# Patient Record
Sex: Female | Born: 1945
Health system: Southern US, Community
[De-identification: ages and names within clinical notes are randomized; demographics above are authoritative.]

## PROBLEM LIST (undated history)

## (undated) DIAGNOSIS — D649 Anemia, unspecified: Secondary | ICD-10-CM

## (undated) DIAGNOSIS — M1990 Unspecified osteoarthritis, unspecified site: Secondary | ICD-10-CM

## (undated) DIAGNOSIS — I10 Essential (primary) hypertension: Secondary | ICD-10-CM

## (undated) DIAGNOSIS — K219 Gastro-esophageal reflux disease without esophagitis: Secondary | ICD-10-CM

## (undated) HISTORY — PX: EYE SURGERY: SHX253

## (undated) HISTORY — PX: CHOLECYSTECTOMY: SHX55

## (undated) HISTORY — PX: UTERINE FIBROID SURGERY: SHX826

## (undated) HISTORY — PX: ABDOMINAL HYSTERECTOMY: SHX81

## (undated) SURGERY — ARTHROPLASTY, KNEE, TOTAL
Anesthesia: General | Laterality: Right

---

## 1999-12-14 ENCOUNTER — Encounter: Payer: Self-pay | Admitting: Family Medicine

## 1999-12-14 ENCOUNTER — Ambulatory Visit (HOSPITAL_COMMUNITY): Admission: RE | Admit: 1999-12-14 | Discharge: 1999-12-14 | Payer: Self-pay | Admitting: Family Medicine

## 2001-03-10 ENCOUNTER — Encounter: Payer: Self-pay | Admitting: Family Medicine

## 2001-03-10 ENCOUNTER — Encounter: Admission: RE | Admit: 2001-03-10 | Discharge: 2001-03-10 | Payer: Self-pay | Admitting: Family Medicine

## 2003-06-08 ENCOUNTER — Other Ambulatory Visit: Admission: RE | Admit: 2003-06-08 | Discharge: 2003-06-08 | Payer: Self-pay | Admitting: Family Medicine

## 2003-06-10 ENCOUNTER — Ambulatory Visit (HOSPITAL_COMMUNITY): Admission: RE | Admit: 2003-06-10 | Discharge: 2003-06-10 | Payer: Self-pay | Admitting: Family Medicine

## 2005-03-21 ENCOUNTER — Inpatient Hospital Stay (HOSPITAL_COMMUNITY): Admission: AD | Admit: 2005-03-21 | Discharge: 2005-03-21 | Payer: Self-pay | Admitting: Ophthalmology

## 2005-06-28 ENCOUNTER — Ambulatory Visit (HOSPITAL_COMMUNITY): Admission: RE | Admit: 2005-06-28 | Discharge: 2005-06-29 | Payer: Self-pay | Admitting: Ophthalmology

## 2007-07-08 ENCOUNTER — Ambulatory Visit (HOSPITAL_COMMUNITY): Admission: RE | Admit: 2007-07-08 | Discharge: 2007-07-08 | Payer: Self-pay | Admitting: Orthopedic Surgery

## 2008-03-17 ENCOUNTER — Emergency Department (HOSPITAL_COMMUNITY): Admission: EM | Admit: 2008-03-17 | Discharge: 2008-03-17 | Payer: Self-pay | Admitting: Emergency Medicine

## 2008-03-24 ENCOUNTER — Ambulatory Visit (HOSPITAL_COMMUNITY): Admission: RE | Admit: 2008-03-24 | Discharge: 2008-03-24 | Payer: Self-pay | Admitting: Family Medicine

## 2008-03-25 ENCOUNTER — Encounter (HOSPITAL_COMMUNITY): Admission: RE | Admit: 2008-03-25 | Discharge: 2008-04-24 | Payer: Self-pay | Admitting: Family Medicine

## 2010-07-15 ENCOUNTER — Encounter: Payer: Self-pay | Admitting: Family Medicine

## 2010-11-10 NOTE — Op Note (Signed)
[name], Ashley Clements              ACCOUNT NO.:  [id number]   MEDICAL RECORD NO.:  [id number]          PATIENT TYPE:  OIB   LOCATION:  5731                         FACILITY:  [facility]   PHYSICIAN:  Beulah Gandy. Ashley Royalty, M.D. DATE OF BIRTH:  10/17/1944   DATE OF PROCEDURE:  06/28/2005  DATE OF DISCHARGE:                                 OPERATIVE REPORT   ADMISSION DIAGNOSIS:  Macular hole, left eye.   PROCEDURES:  Pars plana vitrectomy left eye; membrane peel, left eye; serum  patch, left eye; gas fluid exchange, left eye.   SURGEON:  Alan Mulder.   ASSISTANT:  Rosalie Doctor, MA   ANESTHESIA:  General.   DETAILS:  Usual prep and drape, peritomies at 10, 2 and 4 o'clock.  A 5 mm  infusion port placed at 4 o'clock and anchored into place. Contact lens ring  anchored into place at 6 and 12 o'clock. Methylcellulose placed on the  cornea and the flat contact lens placed. Pars plana vitrectomy begun just  behind the crystalline lens.  Vitreous strands and membranes were  encountered. These were carefully removed under low suction and rapid  cutting guide.  The vitrectomy was carried out into the peripheral vitreous  space and the vitreous was removed down to the vitreous base for 360  degrees. The magnifying contact lens was moved into place and inspection of  the macular hole was seen. The hole was quite deep with yellow granular  material in the depths of the hole. The diamond dusted membrane scraper was  used to peel the internal limiting membrane of the hole and allowed the  edges of the hole to fall together into the middle of the hole.  Additional  remnants were removed. The silicone tip suction line was used to engage  posterior hyaloid and strip it from its attachments to the retina.  A gas  fluid exchange was carried out. Sufficient time was allowed for additional  fluid to track down the walls of the eye and collect in the posterior  segment. The serum patch and the  perfluoropropane 18% mixture was prepared  during this time.  Additional fluid was removed with the [employer]  brush.  The posterior segment was very dry.  Serum patch was delivered. The  C3F8 18% was exchanged for intravitreal gas. The instruments were removed  from the eye and 9-0 nylon was used to close the sclerotomy sites. The  conjunctiva was closed with wet-field cautery at 10 and 2 and with 7-0  chromic at 4.  Polymyxin and gentamicin were irrigated into Tenon's space.  Atropine solution was applied. Marcaine was injected around the globe for  postop pain. TobraDex ophthalmic ointment was placed. Closing tension was 10  with a Barraquer keratometer. Polysporin, a patch and shield were placed.  The patient was awakened and taken to recovery in satisfactory condition.   COMPLICATIONS:  None.   DURATION:  1 hour.      Beulah Gandy. Ashley Royalty, M.D.  Electronically Signed     JDM/MEDQ  D:  06/28/2005  T:  06/29/2005  Job:  [id number]

## 2010-11-10 NOTE — Op Note (Signed)
NAMEZAKHIA, SERES              ACCOUNT NO.:  [id number]   MEDICAL RECORD NO.:  [id number]          PATIENT TYPE:  AMB   LOCATION:  SDS                          FACILITY:  [facility]   PHYSICIAN:  John D. Ashley Royalty, M.D. DATE OF BIRTH:  10/17/1944   DATE OF PROCEDURE:  03/20/2005  DATE OF DISCHARGE:                                 OPERATIVE REPORT   ADMISSION DIAGNOSIS:  Rhegmatogenous retinal detachment in the a left eye.   PROCEDURE:  Scleral buckle, left eye; retinal photocoagulation, left eye;  perfluoropropane injection, left eye.   SURGEON:  Alan Mulder, M.D.   ASSISTANT:  Alma Downs, P.A.   ANESTHESIA:  General.   DETAILS:  Usual prep and drape, 360-degree limbal peritomy, isolation of 4  rectus muscles on 2-0 silk, localization of break at 12 o'clock, scleral  dissection for 360 degrees to admit a #279 intrascleral implant, diathermy  placed in the bed, 279 implant placed with a joint at 8 o'clock, 240 band  placed with a 270 sleeve at 8 o'clock, perforation site chosen at 2 o'clock  in the posterior aspect of the bed.  A large amount of clear colorless  subretinal fluid came forth.  Perfluoropropane 50%, 1.2 mL, was injected  into the vitreous cavity through the pars plana at 12 o'clock.  Another  perforation site was performed at 12 o'clock in the anterior aspect of the  bed; a moderate amount of clear colorless subretinal fluid came forth in  this location.  A 508-G radial segment was fashioned to fit beneath the  break at the 1:30 o'clock.  The scleral flaps were closed with interrupted 4-  0 Mersilene sutures, 8 in number.  The indirect ophthalmoscopy showed the  retina be lying nicely on the buckle with a retinal break well-supported by  the radial element.  The indirect ophthalmoscope laser was moved into place  and 913 burns were placed around the retinal break and on the scleral buckle  for 360 degrees; the power was between 800 and  1200 milliwatts, 1000  microns each and 0.1 seconds each.  The buckle was adjusted and trimmed.  The band was adjusted and trimmed.  The sutures were knotted and trimmed.  The conjunctiva was reposited with 7-0 chromic suture.  Polymyxin and  gentamicin were irrigated into Tenon's space.  Atropine solution was  applied.  Marcaine was injected around the globe for postop pain.  Decadron  10 mg was injected into the lower subconjunctival space.  TobraDex  ophthalmic ointment, a patch and shield were placed.  Closing pressure was  15 with a Barraquer keratometer.  Complications -- none.  Duration -- 2-1/2  hours.  The patient was awakened and taken to Recovery in satisfactory  condition.      Beulah Gandy. Ashley Royalty, M.D.  Electronically Signed     JDM/MEDQ  D:  03/20/2005  T:  03/21/2005  Job:  [id number]

## 2011-08-15 ENCOUNTER — Other Ambulatory Visit (HOSPITAL_COMMUNITY): Payer: Self-pay | Admitting: Family Medicine

## 2011-08-15 DIAGNOSIS — Z139 Encounter for screening, unspecified: Secondary | ICD-10-CM

## 2011-08-21 ENCOUNTER — Ambulatory Visit (HOSPITAL_COMMUNITY)
Admission: RE | Admit: 2011-08-21 | Discharge: 2011-08-21 | Disposition: A | Discharge status: Home / Self Care | Payer: Medicare Other | Source: Ambulatory Visit | Attending: Family Medicine | Admitting: Family Medicine

## 2011-08-21 DIAGNOSIS — Z1231 Encounter for screening mammogram for malignant neoplasm of breast: Secondary | ICD-10-CM | POA: Insufficient documentation

## 2011-08-21 DIAGNOSIS — Z139 Encounter for screening, unspecified: Secondary | ICD-10-CM

## 2012-01-22 ENCOUNTER — Ambulatory Visit
Admission: RE | Admit: 2012-01-22 | Discharge: 2012-01-22 | Disposition: A | Discharge status: Home / Self Care | Payer: Medicare Other | Source: Ambulatory Visit | Attending: Orthopedic Surgery | Admitting: Orthopedic Surgery

## 2012-01-22 ENCOUNTER — Other Ambulatory Visit: Payer: Self-pay | Admitting: Orthopedic Surgery

## 2012-01-22 DIAGNOSIS — R52 Pain, unspecified: Secondary | ICD-10-CM

## 2012-03-19 ENCOUNTER — Encounter (HOSPITAL_COMMUNITY): Payer: Self-pay | Admitting: Pharmacy Technician

## 2012-03-21 ENCOUNTER — Other Ambulatory Visit: Payer: Self-pay | Admitting: Orthopedic Surgery

## 2012-03-21 ENCOUNTER — Encounter (HOSPITAL_COMMUNITY): Payer: Self-pay

## 2012-03-24 ENCOUNTER — Encounter (HOSPITAL_COMMUNITY)
Admission: RE | Admit: 2012-03-24 | Discharge: 2012-03-24 | Disposition: A | Discharge status: Home / Self Care | Payer: Medicare Other | Source: Ambulatory Visit | Attending: Orthopedic Surgery | Admitting: Orthopedic Surgery

## 2012-03-24 ENCOUNTER — Encounter (HOSPITAL_COMMUNITY): Payer: Self-pay

## 2012-03-24 HISTORY — DX: Essential (primary) hypertension: I10

## 2012-03-24 HISTORY — DX: Unspecified osteoarthritis, unspecified site: M19.90

## 2012-03-24 HISTORY — DX: Anemia, unspecified: D64.9

## 2012-03-24 LAB — URINALYSIS, ROUTINE W REFLEX MICROSCOPIC
Ketones, ur: NEGATIVE mg/dL
Leukocytes, UA: NEGATIVE
Protein, ur: NEGATIVE mg/dL
Urobilinogen, UA: 0.2 mg/dL (ref 0.0–1.0)

## 2012-03-24 LAB — COMPREHENSIVE METABOLIC PANEL
BUN: 15 mg/dL (ref 6–23)
CO2: 29 mEq/L (ref 19–32)
Calcium: 9.7 mg/dL (ref 8.4–10.5)
Creatinine, Ser: 0.71 mg/dL (ref 0.50–1.10)
GFR calc Af Amer: >90 mL/min (ref >90)
GFR calc non Af Amer: 87 mL/min — ABNORMAL LOW (ref >90)
Glucose, Bld: 86 mg/dL (ref 70–99)
Total Protein: 7.2 g/dL (ref 6.0–8.3)

## 2012-03-24 LAB — CBC
HCT: 37.4 % (ref 36.0–46.0)
Hemoglobin: 12.2 g/dL (ref 12.0–15.0)
MCH: 27.7 pg (ref 26.0–34.0)
MCHC: 32.6 g/dL (ref 30.0–36.0)
MCV: 85 fL (ref 78.0–100.0)

## 2012-03-24 LAB — SURGICAL PCR SCREEN: Staphylococcus aureus: POSITIVE — AB

## 2012-03-24 LAB — PROTIME-INR: Prothrombin Time: 13.8 seconds (ref 11.6–15.2)

## 2012-03-24 LAB — TYPE AND SCREEN: ABO/RH(D): A POS

## 2012-03-24 LAB — ABO/RH: ABO/RH(D): A POS

## 2012-03-24 MED ORDER — CHLORHEXIDINE GLUCONATE 4 % EX LIQD: 60.0000 mL | Freq: Once | CUTANEOUS | Status: DC

## 2012-03-24 NOTE — Pre-Procedure Instructions (Signed)
20 [name]  03/24/2012   Your procedure is scheduled on:  Thursday April 03, 2012  Report to [facility] at 5:30 AM.  Call this number if you have problems the morning of surgery: [phone]   Remember:   Do not eat food or drink After Midnight.      Take these medicines the morning of surgery with A SIP OF WATER: ranitidine, evista   Do not wear jewelry, make-up or nail polish.  Do not wear lotions, powders, or perfumes  Do not shave 48 hours prior to surgery. Men may shave face and neck.  Do not bring valuables to the hospital.  Contacts, dentures or bridgework may not be worn into surgery.  Leave suitcase in the car. After surgery it may be brought to your room.  For patients admitted to the hospital, checkout time is 11:00 AM the day of discharge.   Patients discharged the day of surgery will not be allowed to drive home.  Name and phone number of your driver: family / friend  Special Instructions: Incentive Spirometry - Practice and bring it with you on the day of surgery. Shower using CHG 2 nights before surgery and the night before surgery.  If you shower the day of surgery use CHG.  Use special wash - you have one bottle of CHG for all showers.  You should use approximately 1/3 of the bottle for each shower.   Please read over the following fact sheets that you were given: Pain Booklet, Coughing and Deep Breathing, Blood Transfusion Information, Total Joint Packet, MRSA Information and Surgical Site Infection Prevention

## 2012-04-02 MED ORDER — DEXTROSE 5 % IV SOLN
3.0000 g | INTRAVENOUS | Status: DC
  Filled 2012-04-02: qty 3000

## 2012-04-03 ENCOUNTER — Other Ambulatory Visit: Payer: Self-pay | Admitting: Orthopedic Surgery

## 2012-04-03 ENCOUNTER — Encounter (HOSPITAL_COMMUNITY): Payer: Self-pay | Admitting: Anesthesiology

## 2012-04-03 ENCOUNTER — Encounter (HOSPITAL_COMMUNITY): Payer: Self-pay | Admitting: *Deleted

## 2012-04-03 ENCOUNTER — Encounter (HOSPITAL_COMMUNITY)
Admission: RE | Disposition: A | Discharge status: Home Health | Payer: Self-pay | Source: Ambulatory Visit | Attending: Orthopedic Surgery

## 2012-04-03 ENCOUNTER — Inpatient Hospital Stay (HOSPITAL_COMMUNITY): Payer: Medicare Other | Admitting: Anesthesiology

## 2012-04-03 ENCOUNTER — Inpatient Hospital Stay (HOSPITAL_COMMUNITY)
Admission: RE | Admit: 2012-04-03 | Discharge: 2012-04-07 | DRG: 470 | Disposition: A | Discharge status: Home Health | Payer: Medicare Other | Source: Ambulatory Visit | Attending: Orthopedic Surgery | Admitting: Orthopedic Surgery

## 2012-04-03 DIAGNOSIS — I10 Essential (primary) hypertension: Secondary | ICD-10-CM | POA: Diagnosis present

## 2012-04-03 DIAGNOSIS — G8918 Other acute postprocedural pain: Secondary | ICD-10-CM

## 2012-04-03 DIAGNOSIS — D649 Anemia, unspecified: Secondary | ICD-10-CM | POA: Diagnosis present

## 2012-04-03 DIAGNOSIS — E119 Type 2 diabetes mellitus without complications: Secondary | ICD-10-CM | POA: Diagnosis present

## 2012-04-03 DIAGNOSIS — M1710 Unilateral primary osteoarthritis, unspecified knee: Principal | ICD-10-CM | POA: Diagnosis present

## 2012-04-03 HISTORY — PX: TOTAL KNEE ARTHROPLASTY: SHX125

## 2012-04-03 LAB — GLUCOSE, CAPILLARY: Glucose-Capillary: 85 mg/dL (ref 70–99)

## 2012-04-03 SURGERY — ARTHROPLASTY, KNEE, TOTAL
Anesthesia: General | Site: Knee | Laterality: Right | Wound class: Clean

## 2012-04-03 MED ORDER — ACETAMINOPHEN 10 MG/ML IV SOLN
1000.0000 mg | Freq: Once | INTRAVENOUS | Status: AC
  Administered 2012-04-03: 1000 mg via INTRAVENOUS
  Filled 2012-04-03: qty 100

## 2012-04-03 MED ORDER — NALOXONE HCL 0.4 MG/ML IJ SOLN: 0.4000 mg | INTRAMUSCULAR | Status: DC | PRN

## 2012-04-03 MED ORDER — DIPHENHYDRAMINE HCL 50 MG/ML IJ SOLN: 12.5000 mg | Freq: Four times a day (QID) | INTRAMUSCULAR | Status: DC | PRN

## 2012-04-03 MED ORDER — PHENYLEPHRINE HCL 10 MG/ML IJ SOLN
INTRAMUSCULAR | Status: DC | PRN
  Administered 2012-04-03 (×3): 40 ug via INTRAVENOUS
  Administered 2012-04-03: 80 ug via INTRAVENOUS
  Administered 2012-04-03 (×2): 40 ug via INTRAVENOUS

## 2012-04-03 MED ORDER — CEFAZOLIN SODIUM 1-5 GM-% IV SOLN
1.0000 g | Freq: Four times a day (QID) | INTRAVENOUS | Status: AC
  Administered 2012-04-04 (×2): 1 g via INTRAVENOUS
  Filled 2012-04-03 (×2): qty 50

## 2012-04-03 MED ORDER — CEFAZOLIN SODIUM-DEXTROSE 2-3 GM-% IV SOLR
2.0000 g | Freq: Once | INTRAVENOUS | Status: AC
  Administered 2012-04-03: 2 g via INTRAVENOUS

## 2012-04-03 MED ORDER — ACETAMINOPHEN 650 MG RE SUPP: 650.0000 mg | Freq: Four times a day (QID) | RECTAL | Status: DC | PRN

## 2012-04-03 MED ORDER — ONDANSETRON HCL 4 MG/2ML IJ SOLN
INTRAMUSCULAR | Status: DC | PRN
  Administered 2012-04-03: 4 mg via INTRAVENOUS

## 2012-04-03 MED ORDER — COUMADIN BOOK
Freq: Once | Status: DC
  Filled 2012-04-03: qty 1

## 2012-04-03 MED ORDER — ACETAMINOPHEN 10 MG/ML IV SOLN
1000.0000 mg | Freq: Four times a day (QID) | INTRAVENOUS | Status: AC
  Administered 2012-04-03 – 2012-04-04 (×2): 1000 mg via INTRAVENOUS
  Filled 2012-04-03 (×3): qty 100

## 2012-04-03 MED ORDER — ROCURONIUM BROMIDE 100 MG/10ML IV SOLN
INTRAVENOUS | Status: DC | PRN
  Administered 2012-04-03: 50 mg via INTRAVENOUS

## 2012-04-03 MED ORDER — ACETAMINOPHEN 325 MG PO TABS: 650.0000 mg | ORAL_TABLET | Freq: Four times a day (QID) | ORAL | Status: DC | PRN

## 2012-04-03 MED ORDER — LACTATED RINGERS IV SOLN
INTRAVENOUS | Status: DC | PRN
  Administered 2012-04-03 (×2): via INTRAVENOUS

## 2012-04-03 MED ORDER — ONDANSETRON HCL 4 MG/2ML IJ SOLN: 4.0000 mg | Freq: Four times a day (QID) | INTRAMUSCULAR | Status: DC | PRN

## 2012-04-03 MED ORDER — FERROUS SULFATE 325 (65 FE) MG PO TABS
325.0000 mg | ORAL_TABLET | Freq: Three times a day (TID) | ORAL | Status: DC
  Administered 2012-04-03 – 2012-04-07 (×11): 325 mg via ORAL
  Filled 2012-04-03 (×14): qty 1

## 2012-04-03 MED ORDER — ONDANSETRON HCL 4 MG/2ML IJ SOLN: 4.0000 mg | Freq: Once | INTRAMUSCULAR | Status: DC | PRN

## 2012-04-03 MED ORDER — PHENOL 1.4 % MT LIQD: 1.0000 | OROMUCOSAL | Status: DC | PRN

## 2012-04-03 MED ORDER — HYDROCHLOROTHIAZIDE 12.5 MG PO CAPS
12.5000 mg | ORAL_CAPSULE | Freq: Every day | ORAL | Status: DC
  Administered 2012-04-04 – 2012-04-07 (×4): 12.5 mg via ORAL
  Filled 2012-04-03 (×5): qty 1

## 2012-04-03 MED ORDER — HYDROMORPHONE HCL PF 1 MG/ML IJ SOLN
0.2500 mg | INTRAMUSCULAR | Status: DC | PRN
  Administered 2012-04-03 (×3): 0.5 mg via INTRAVENOUS

## 2012-04-03 MED ORDER — EPHEDRINE SULFATE 50 MG/ML IJ SOLN
INTRAMUSCULAR | Status: DC | PRN
  Administered 2012-04-03: 5 mg via INTRAVENOUS

## 2012-04-03 MED ORDER — SODIUM CHLORIDE 0.9 % IR SOLN
Status: DC | PRN
  Administered 2012-04-03: 3000 mL

## 2012-04-03 MED ORDER — VALSARTAN-HYDROCHLOROTHIAZIDE 80-12.5 MG PO TABS: 1.0000 | ORAL_TABLET | Freq: Every day | ORAL | Status: DC

## 2012-04-03 MED ORDER — BUPIVACAINE-EPINEPHRINE PF 0.5-1:200000 % IJ SOLN
INTRAMUSCULAR | Status: DC | PRN
  Administered 2012-04-03: 25 mL

## 2012-04-03 MED ORDER — HYDROMORPHONE 0.3 MG/ML IV SOLN
INTRAVENOUS | Status: AC
  Filled 2012-04-03: qty 25

## 2012-04-03 MED ORDER — LIDOCAINE HCL 4 % MT SOLN
OROMUCOSAL | Status: DC | PRN
  Administered 2012-04-03: 4 mL via TOPICAL

## 2012-04-03 MED ORDER — FAMOTIDINE 10 MG PO TABS
10.0000 mg | ORAL_TABLET | Freq: Every day | ORAL | Status: DC
  Administered 2012-04-04 – 2012-04-07 (×4): 10 mg via ORAL
  Filled 2012-04-03 (×5): qty 1

## 2012-04-03 MED ORDER — MENTHOL 3 MG MT LOZG: 1.0000 | LOZENGE | OROMUCOSAL | Status: DC | PRN

## 2012-04-03 MED ORDER — HYDROMORPHONE HCL PF 1 MG/ML IJ SOLN
INTRAMUSCULAR | Status: AC
  Filled 2012-04-03: qty 1

## 2012-04-03 MED ORDER — DOCUSATE SODIUM 100 MG PO CAPS
100.0000 mg | ORAL_CAPSULE | Freq: Two times a day (BID) | ORAL | Status: DC
  Administered 2012-04-03 – 2012-04-07 (×8): 100 mg via ORAL
  Filled 2012-04-03 (×9): qty 1

## 2012-04-03 MED ORDER — OXYCODONE HCL 5 MG PO TABS
5.0000 mg | ORAL_TABLET | ORAL | Status: DC | PRN
  Administered 2012-04-05 (×2): 5 mg via ORAL
  Administered 2012-04-06: 10 mg via ORAL
  Administered 2012-04-06: 5 mg via ORAL
  Administered 2012-04-06: 10 mg via ORAL
  Administered 2012-04-07 (×2): 5 mg via ORAL
  Filled 2012-04-03 (×2): qty 2
  Filled 2012-04-03 (×5): qty 1

## 2012-04-03 MED ORDER — FENTANYL CITRATE 0.05 MG/ML IJ SOLN
INTRAMUSCULAR | Status: DC | PRN
  Administered 2012-04-03 (×4): 50 ug via INTRAVENOUS

## 2012-04-03 MED ORDER — DEXTROSE-NACL 5-0.45 % IV SOLN
INTRAVENOUS | Status: DC
  Administered 2012-04-03 – 2012-04-04 (×3): via INTRAVENOUS

## 2012-04-03 MED ORDER — ONDANSETRON HCL 4 MG PO TABS: 4.0000 mg | ORAL_TABLET | Freq: Four times a day (QID) | ORAL | Status: DC | PRN

## 2012-04-03 MED ORDER — NEOSTIGMINE METHYLSULFATE 1 MG/ML IJ SOLN
INTRAMUSCULAR | Status: DC | PRN
  Administered 2012-04-03: 3 mg via INTRAVENOUS

## 2012-04-03 MED ORDER — DIPHENHYDRAMINE HCL 12.5 MG/5ML PO ELIX: 12.5000 mg | ORAL_SOLUTION | Freq: Four times a day (QID) | ORAL | Status: DC | PRN

## 2012-04-03 MED ORDER — MIDAZOLAM HCL 5 MG/5ML IJ SOLN
INTRAMUSCULAR | Status: DC | PRN
  Administered 2012-04-03: 2 mg via INTRAVENOUS

## 2012-04-03 MED ORDER — METOCLOPRAMIDE HCL 10 MG PO TABS: 5.0000 mg | ORAL_TABLET | Freq: Three times a day (TID) | ORAL | Status: DC | PRN

## 2012-04-03 MED ORDER — ACETAMINOPHEN 10 MG/ML IV SOLN
INTRAVENOUS | Status: AC
  Filled 2012-04-03: qty 100

## 2012-04-03 MED ORDER — PROPOFOL 10 MG/ML IV BOLUS
INTRAVENOUS | Status: DC | PRN
  Administered 2012-04-03: 200 mg via INTRAVENOUS

## 2012-04-03 MED ORDER — IRBESARTAN 75 MG PO TABS
75.0000 mg | ORAL_TABLET | Freq: Every day | ORAL | Status: DC
  Administered 2012-04-04 – 2012-04-07 (×4): 75 mg via ORAL
  Filled 2012-04-03 (×5): qty 1

## 2012-04-03 MED ORDER — RALOXIFENE HCL 60 MG PO TABS
60.0000 mg | ORAL_TABLET | Freq: Every day | ORAL | Status: DC
  Administered 2012-04-04 – 2012-04-07 (×4): 60 mg via ORAL
  Filled 2012-04-03 (×4): qty 1

## 2012-04-03 MED ORDER — METHOCARBAMOL 100 MG/ML IJ SOLN
500.0000 mg | Freq: Four times a day (QID) | INTRAVENOUS | Status: DC | PRN
  Administered 2012-04-03: 500 mg via INTRAVENOUS
  Filled 2012-04-03: qty 5

## 2012-04-03 MED ORDER — LIDOCAINE HCL (CARDIAC) 20 MG/ML IV SOLN
INTRAVENOUS | Status: DC | PRN
  Administered 2012-04-03: 100 mg via INTRAVENOUS

## 2012-04-03 MED ORDER — WARFARIN - PHARMACIST DOSING INPATIENT
Freq: Every day | Status: DC
  Administered 2012-04-06: 18:00:00

## 2012-04-03 MED ORDER — METHOCARBAMOL 500 MG PO TABS
500.0000 mg | ORAL_TABLET | Freq: Four times a day (QID) | ORAL | Status: DC | PRN
  Administered 2012-04-04 – 2012-04-06 (×2): 500 mg via ORAL
  Filled 2012-04-03 (×3): qty 1

## 2012-04-03 MED ORDER — THERA M PLUS PO TABS: 1.0000 | ORAL_TABLET | ORAL | Status: DC

## 2012-04-03 MED ORDER — ATORVASTATIN CALCIUM 10 MG PO TABS
10.0000 mg | ORAL_TABLET | Freq: Every day | ORAL | Status: DC
  Administered 2012-04-04 – 2012-04-06 (×3): 10 mg via ORAL
  Filled 2012-04-03 (×5): qty 1

## 2012-04-03 MED ORDER — WARFARIN VIDEO: Freq: Once | Status: DC

## 2012-04-03 MED ORDER — 0.9 % SODIUM CHLORIDE (POUR BTL) OPTIME
TOPICAL | Status: DC | PRN
  Administered 2012-04-03: 1000 mL

## 2012-04-03 MED ORDER — SODIUM CHLORIDE 0.9 % IJ SOLN: 9.0000 mL | INTRAMUSCULAR | Status: DC | PRN

## 2012-04-03 MED ORDER — ADULT MULTIVITAMIN W/MINERALS CH
1.0000 | ORAL_TABLET | Freq: Every day | ORAL | Status: DC
  Administered 2012-04-03 – 2012-04-07 (×5): 1 via ORAL
  Filled 2012-04-03 (×5): qty 1

## 2012-04-03 MED ORDER — CEFAZOLIN SODIUM 1-5 GM-% IV SOLN
INTRAVENOUS | Status: AC
  Filled 2012-04-03: qty 100

## 2012-04-03 MED ORDER — METOCLOPRAMIDE HCL 5 MG/ML IJ SOLN: 5.0000 mg | Freq: Three times a day (TID) | INTRAMUSCULAR | Status: DC | PRN

## 2012-04-03 MED ORDER — HYDROMORPHONE 0.3 MG/ML IV SOLN
INTRAVENOUS | Status: DC
  Administered 2012-04-03: 11:00:00 via INTRAVENOUS
  Administered 2012-04-03: 2 mg via INTRAVENOUS
  Administered 2012-04-04: 0.8 mg via INTRAVENOUS
  Administered 2012-04-04: 0.199 mg via INTRAVENOUS
  Administered 2012-04-04: 0.4 mg via INTRAVENOUS
  Administered 2012-04-04: 0.2 mg via INTRAVENOUS
  Administered 2012-04-04: 0.339 mg via INTRAVENOUS

## 2012-04-03 MED ORDER — WARFARIN SODIUM 6 MG PO TABS
6.0000 mg | ORAL_TABLET | Freq: Once | ORAL | Status: AC
  Administered 2012-04-03: 6 mg via ORAL
  Filled 2012-04-03: qty 1

## 2012-04-03 MED ORDER — GLYCOPYRROLATE 0.2 MG/ML IJ SOLN
INTRAMUSCULAR | Status: DC | PRN
  Administered 2012-04-03: 0.4 mg via INTRAVENOUS

## 2012-04-03 SURGICAL SUPPLY — 54 items
BANDAGE ELASTIC 4 VELCRO ST LF (GAUZE/BANDAGES/DRESSINGS) ×1 IMPLANT
BANDAGE ELASTIC 6 VELCRO ST LF (GAUZE/BANDAGES/DRESSINGS) ×1 IMPLANT
BANDAGE ESMARK 6X9 LF (GAUZE/BANDAGES/DRESSINGS) ×1 IMPLANT
BANDAGE GAUZE ELAST BULKY 4 IN (GAUZE/BANDAGES/DRESSINGS) ×2 IMPLANT
BLADE SAGITTAL 25.0X1.27X90 (BLADE) ×2 IMPLANT
BNDG CMPR 9X6 STRL LF SNTH (GAUZE/BANDAGES/DRESSINGS)
BNDG CMPR MED 10X6 ELC LF (GAUZE/BANDAGES/DRESSINGS) ×1
BNDG ELASTIC 6X10 VLCR STRL LF (GAUZE/BANDAGES/DRESSINGS) ×2 IMPLANT
BNDG ESMARK 6X9 LF (GAUZE/BANDAGES/DRESSINGS)
BONE CEMENT PALACOSE (Orthopedic Implant) ×2 IMPLANT
BOWL SMART MIX CTS (DISPOSABLE) ×2 IMPLANT
CATH FOLEY LATEX FREE 14FR (CATHETERS) ×2
CATH FOLEY LF 14FR (CATHETERS) IMPLANT
CEMENT BONE PALACOSE (Orthopedic Implant) ×2 IMPLANT
CLOTH BEACON ORANGE TIMEOUT ST (SAFETY) ×2 IMPLANT
COVER SURGICAL LIGHT HANDLE (MISCELLANEOUS) ×2 IMPLANT
CUFF TOURNIQUET SINGLE 34IN LL (TOURNIQUET CUFF) ×1 IMPLANT
CUFF TOURNIQUET SINGLE 44IN (TOURNIQUET CUFF) IMPLANT
DRAPE EXTREMITY T 121X128X90 (DRAPE) ×2 IMPLANT
DRAPE U-SHAPE 47X51 STRL (DRAPES) ×2 IMPLANT
DRESSING ADAPTIC 1/2  N-ADH (PACKING) ×1 IMPLANT
DRSG ADAPTIC 3X8 NADH LF (GAUZE/BANDAGES/DRESSINGS) ×2 IMPLANT
DRSG PAD ABDOMINAL 8X10 ST (GAUZE/BANDAGES/DRESSINGS) ×2 IMPLANT
DURAPREP 26ML APPLICATOR (WOUND CARE) ×2 IMPLANT
ELECT REM PT RETURN 9FT ADLT (ELECTROSURGICAL) ×2
ELECTRODE REM PT RTRN 9FT ADLT (ELECTROSURGICAL) ×1 IMPLANT
EVACUATOR 3/16  PVC DRAIN (DRAIN)
EVACUATOR 3/16 PVC DRAIN (DRAIN) IMPLANT
FACESHIELD LNG OPTICON STERILE (SAFETY) ×2 IMPLANT
GLOVE SS PI 9.0 STRL (GLOVE) ×2 IMPLANT
GLOVE SURG SS PI 7.5 STRL IVOR (GLOVE) ×1 IMPLANT
GOWN PREVENTION PLUS XLARGE (GOWN DISPOSABLE) ×2 IMPLANT
GOWN STRL NON-REIN LRG LVL3 (GOWN DISPOSABLE) ×6 IMPLANT
HANDPIECE INTERPULSE COAX TIP (DISPOSABLE) ×2
IMMOBILIZER KNEE 20 (SOFTGOODS)
IMMOBILIZER KNEE 20 THIGH 36 (SOFTGOODS) IMPLANT
IMMOBILIZER KNEE 22 UNIV (SOFTGOODS) ×2 IMPLANT
IMMOBILIZER KNEE 24 THIGH 36 (MISCELLANEOUS) IMPLANT
IMMOBILIZER KNEE 24 UNIV (MISCELLANEOUS)
KIT BASIN OR (CUSTOM PROCEDURE TRAY) ×2 IMPLANT
KIT ROOM TURNOVER OR (KITS) ×2 IMPLANT
MANIFOLD NEPTUNE II (INSTRUMENTS) ×2 IMPLANT
NS IRRIG 1000ML POUR BTL (IV SOLUTION) ×2 IMPLANT
PACK TOTAL JOINT (CUSTOM PROCEDURE TRAY) ×2 IMPLANT
PAD ARMBOARD 7.5X6 YLW CONV (MISCELLANEOUS) ×4 IMPLANT
SET HNDPC FAN SPRY TIP SCT (DISPOSABLE) ×1 IMPLANT
SPONGE GAUZE 4X4 12PLY (GAUZE/BANDAGES/DRESSINGS) ×2 IMPLANT
STAPLER VISISTAT 35W (STAPLE) ×2 IMPLANT
SUT VIC AB 0 CTB1 27 (SUTURE) ×8 IMPLANT
SUT VIC AB 2-0 CTB1 (SUTURE) ×8 IMPLANT
TOWEL OR 17X24 6PK STRL BLUE (TOWEL DISPOSABLE) ×2 IMPLANT
TOWEL OR 17X26 10 PK STRL BLUE (TOWEL DISPOSABLE) ×2 IMPLANT
TRAY FOLEY CATH 14FR (SET/KITS/TRAYS/PACK) ×1 IMPLANT
WATER STERILE IRR 1000ML POUR (IV SOLUTION) ×4 IMPLANT

## 2012-04-03 NOTE — Progress Notes (Signed)
ANTICOAGULATION CONSULT NOTE - Initial Consult  Pharmacy Consult for Coumadin Indication: VTE prophylaxis  Allergies  Allergen Reactions  . Latex   . Peanut-Containing Drug Products Rash    All nuts    Patient Measurements:  Wt 83.5kg Heparin Dosing Weight:   Vital Signs: Temp: 97.8 F (36.6 C) (10/10 1510) Temp src: Oral (10/10 0613) BP: 110/54 mmHg (10/10 1500) Pulse Rate: 75  (10/10 1510)  Labs: No results found for this basename: HGB:2,HCT:3,PLT:3,APTT:3,LABPROT:3,INR:3,HEPARINUNFRC:3,CREATININE:3,CKTOTAL:3,CKMB:3,TROPONINI:3 in the last 72 hours  CrCl is unknown because there is no height on file for the current visit.   Medical History: Past Medical History  Diagnosis Date  . Hypertension   . Diabetes mellitus     borderline  . Arthritis   . Anemia     Medications:  Prescriptions prior to admission  Medication Sig Dispense Refill  . CALCIUM-VITAMIN D PO Take 1 tablet by mouth 2 (two) times a week.      . celecoxib (CELEBREX) 200 MG capsule Take 400 mg by mouth daily.      . Multiple Vitamins-Minerals (MULTIVITAMINS THER. W/MINERALS) TABS Take 1 tablet by mouth 2 (two) times a week.      . niacin 500 MG tablet Take 100 mg by mouth at bedtime.      . raloxifene (EVISTA) 60 MG tablet Take 60 mg by mouth daily.      Marland Kitchen RANITIDINE HCL PO Take 1 tablet by mouth daily.      . rosuvastatin (CRESTOR) 20 MG tablet Take 20 mg by mouth daily.      . valsartan-hydrochlorothiazide (DIOVAN-HCT) 80-12.5 MG per tablet Take 1 tablet by mouth daily.      Marland Kitchen aspirin 325 MG EC tablet Take 325 mg by mouth at bedtime.        Assessment: 66yof s/p TKA to start Coumadin for VTE prophylaxis.  - Baseline INR: 1.07 - CBC wnl (9/30) - AST/ALT wnl - No significant bleeding reported - Coumadin points: 4  Goal of Therapy:  INR 2-3   Plan:  1. Coumadin 6mg  po x 1 today 2. Daily INR 3. Coumadin educational book/video  Cleon Dew [phone] 04/03/2012,4:05  PM

## 2012-04-03 NOTE — Anesthesia Preprocedure Evaluation (Addendum)
Anesthesia Evaluation  Patient identified by MRN, date of birth, ID band Patient awake, Patient confused and Patient unresponsive    Reviewed: Allergy & Precautions, H&P , NPO status , Patient's Chart, lab work & pertinent test results  History of Anesthesia Complications Negative for: history of anesthetic complications  Airway Mallampati: I TM Distance: >3 FB Neck ROM: full    Dental  (+) Teeth Intact and Dental Advisory Given   Pulmonary          Cardiovascular hypertension, Pt. on medications Rhythm:regular Rate:Normal     Neuro/Psych    GI/Hepatic GERD-  Medicated,  Endo/Other  diabetes, Type 2  Renal/GU      Musculoskeletal   Abdominal   Peds  Hematology   Anesthesia Other Findings   Reproductive/Obstetrics                          Anesthesia Physical Anesthesia Plan  ASA: II  Anesthesia Plan: General   Post-op Pain Management:    Induction: Intravenous  Airway Management Planned: Oral ETT and LMA  Additional Equipment:   Intra-op Plan:   Post-operative Plan: Extubation in OR  Informed Consent: I have reviewed the patients History and Physical, chart, labs and discussed the procedure including the risks, benefits and alternatives for the proposed anesthesia with the patient or authorized representative who has indicated his/her understanding and acceptance.     Plan Discussed with: CRNA, Anesthesiologist and Surgeon  Anesthesia Plan Comments:         Anesthesia Quick Evaluation

## 2012-04-03 NOTE — Transfer of Care (Signed)
Immediate Anesthesia Transfer of Care Note  Patient: Ashley Clements  Procedure(s) Performed: Procedure(s) (LRB) with comments: TOTAL KNEE ARTHROPLASTY (Right)  Patient Location: PACU  Anesthesia Type: General  Level of Consciousness: awake, alert  and oriented  Airway & Oxygen Therapy: Patient Spontanous Breathing and Patient connected to nasal cannula oxygen  Post-op Assessment: Report given to PACU RN and Post -op Vital signs reviewed and stable  Post vital signs: Reviewed  Complications: No apparent anesthesia complications

## 2012-04-03 NOTE — Anesthesia Procedure Notes (Addendum)
Anesthesia Regional Block:  Femoral nerve block  Pre-Anesthetic Checklist: ,, timeout performed, Correct Patient, Correct Site, Correct Laterality, Correct Procedure, Correct Position, site marked, Risks and benefits discussed,  Surgical consent,  Pre-op evaluation,  At surgeon's request and post-op pain management  Laterality: Right  Prep: Maximum Sterile Barrier Precautions used, chloraprep and alcohol swabs       Needles:  Injection technique: Single-shot  Needle Type: Stimulator Needle - 80        Needle insertion depth: 7 cm   Additional Needles:  Procedures: nerve stimulator Femoral nerve block  Nerve Stimulator or Paresthesia:  Response: 0.5 mA, 0.1 ms, 7 cm  Additional Responses:   Narrative:  Start time: 04/03/2012 7:10 AM End time: 04/03/2012 7:15 AM Injection made incrementally with aspirations every 5 mL.  Performed by: Personally  Anesthesiologist: Maren Beach MD  Additional Notes: Pt accepts procedure and risks. 0.5% Marcaine (25cc) w/ epi w/o discomfort or difficulty. GES   Procedure Name: Intubation Date/Time: 04/03/2012 8:03 AM Performed by: Romie Minus K Pre-anesthesia Checklist: Patient identified, Emergency Drugs available, Suction available, Patient being monitored and Timeout performed Patient Re-evaluated:Patient Re-evaluated prior to inductionOxygen Delivery Method: Circle system utilized Preoxygenation: Pre-oxygenation with 100% oxygen Intubation Type: IV induction Ventilation: Mask ventilation without difficulty Laryngoscope Size: Miller and 2 Grade View: Grade I Tube type: Oral Tube size: 7.5 mm Number of attempts: 1 Airway Equipment and Method: Stylet and LTA kit utilized Placement Confirmation: ETT inserted through vocal cords under direct vision,  positive ETCO2 and breath sounds checked- equal and bilateral Secured at: 21 cm Tube secured with: Tape Dental Injury: Teeth and Oropharynx as per pre-operative assessment

## 2012-04-03 NOTE — H&P (Signed)
Ashley Clements is an 66 y.o. female.   Chief Complaint: PAINFUL RIGHT KNEE HPI: THIS IS A 66Y/O FEMALE FOLLOWED FOR DJD OF THE RIGHT KNEE AND TREATED WITH PAIN MED, ANTI-INFLAMMATORY, AND USE OF CANE WITH PROGRESSIVE WORSENING OF FUNCTION.  Past Medical History  Diagnosis Date  . Hypertension   . Diabetes mellitus     borderline  . Arthritis   . Anemia     Past Surgical History  Procedure Date  . Cholecystectomy   . Abdominal hysterectomy   . Eye surgery     Cataract left, Retina  detachment left    History reviewed. No pertinent family history. Social History:  reports that she has never smoked. She does not have any smokeless tobacco history on file. She reports that she does not drink alcohol or use illicit drugs.  Allergies:  Allergies  Allergen Reactions  . Latex   . Peanut-Containing Drug Products Rash    All nuts    Medications Prior to Admission  Medication Sig Dispense Refill  . CALCIUM-VITAMIN D PO Take 1 tablet by mouth 2 (two) times a week.      . celecoxib (CELEBREX) 200 MG capsule Take 400 mg by mouth daily.      . Multiple Vitamins-Minerals (MULTIVITAMINS THER. W/MINERALS) TABS Take 1 tablet by mouth 2 (two) times a week.      . niacin 500 MG tablet Take 100 mg by mouth at bedtime.      . raloxifene (EVISTA) 60 MG tablet Take 60 mg by mouth daily.      Marland Kitchen RANITIDINE HCL PO Take 1 tablet by mouth daily.      . rosuvastatin (CRESTOR) 20 MG tablet Take 20 mg by mouth daily.      . valsartan-hydrochlorothiazide (DIOVAN-HCT) 80-12.5 MG per tablet Take 1 tablet by mouth daily.      Marland Kitchen aspirin 325 MG EC tablet Take 325 mg by mouth at bedtime.        Results for orders placed during the hospital encounter of 04/03/12 (from the past 48 hour(s))  GLUCOSE, CAPILLARY     Status: Normal   Collection Time   04/03/12  6:25 AM      Component Value Range Comment   Glucose-Capillary 85  70 - 99 mg/dL    No results found.  Review of Systems  Constitutional:  Negative.   HENT: Negative.   Eyes: Negative.   Respiratory: Negative.   Cardiovascular: Negative.   Gastrointestinal: Negative.   Genitourinary: Negative.   Musculoskeletal: Positive for joint pain.  Skin: Negative.   Neurological: Negative.   Endo/Heme/Allergies: Negative.   Psychiatric/Behavioral: Negative.     Blood pressure 156/77, pulse 64, temperature 98.2 F (36.8 C), temperature source Oral, resp. rate 18, SpO2 96.00%. Physical Exam RIGHT KNEE WITH EFFUSION, TENDER CREPITUS MEDIAL AND LATERAL COMPARTMENTS, ROM GOOD BUT LACK FULL FLEXION. X-R RIGHT KNEE REVEALS LOSS OF JOINT SPACE ,SCLEROSIS WITH OSTEOPHYTES.   Assessment/Plan DJD RIGHT KNEE/ PLAN RIGHT TKA  Afrika Brick F 04/03/2012, 7:39 AM

## 2012-04-03 NOTE — Progress Notes (Signed)
UR COMPLETED  

## 2012-04-03 NOTE — Anesthesia Postprocedure Evaluation (Signed)
  Anesthesia Post-op Note  Patient: Ashley Clements  Procedure(s) Performed: Procedure(s) (LRB) with comments: TOTAL KNEE ARTHROPLASTY (Right)  Patient Location: PACU  Anesthesia Type: GA combined with regional for post-op pain  Level of Consciousness: awake, oriented, sedated and patient cooperative  Airway and Oxygen Therapy: Patient Spontanous Breathing and Patient connected to nasal cannula oxygen  Post-op Pain: none  Post-op Assessment: Post-op Vital signs reviewed, Patient's Cardiovascular Status Stable, Respiratory Function Stable and Patent Airway  Post-op Vital Signs: stable  Complications: No apparent anesthesia complications

## 2012-04-03 NOTE — Progress Notes (Signed)
Orthopedic Tech Progress Note Patient Details:  Ashley Clements Oct 04, 1945 [id number]  Patient ID: Ashley Clements, female   DOB: 04-Nov-1945, 66 y.o.   MRN: [id number]   Ashley Clements 04/03/2012, 11:47 AM Right cpm 0-60 trapeze bar

## 2012-04-03 NOTE — Preoperative (Signed)
Beta Blockers   Reason not to administer Beta Blockers:Not Applicable 

## 2012-04-03 NOTE — Brief Op Note (Signed)
04/03/2012  10:33 AM  PATIENT:  Otho Ket  66 y.o. female  PRE-OPERATIVE DIAGNOSIS:  degenerative joint disease right knee   POST-OPERATIVE DIAGNOSIS:  degenerative joint disease right knee  PROCEDURE:  Procedure(s) (LRB) with comments: TOTAL KNEE ARTHROPLASTY (Right)  SURGEON:  Surgeon(s) and Role:    * Kennieth Rad, MD - Primary  PHYSICIAN ASSISTANT:   ASSISTANTS: none   ANESTHESIA:   general  EBL:  Total I/O In: 1000 [I.V.:1000] Out: 100 [Urine:100]  BLOOD ADMINISTERED:none  DRAINS: none   LOCAL MEDICATIONS USED:  NONE  SPECIMEN:  No Specimen  DISPOSITION OF SPECIMEN:  N/A  COUNTS:  YES  TOURNIQUET:   Total Tourniquet Time Documented: Thigh (Right) - 93 minutes  DICTATION: .Other Dictation: Dictation Number REPORT # [id number]  PLAN OF CARE: Admit to inpatient   PATIENT DISPOSITION:  PACU - hemodynamically stable.   Delay start of Pharmacological VTE agent (>24hrs) due to surgical blood loss or risk of bleeding: yes

## 2012-04-03 NOTE — H&P (Signed)
NAMECHRISTINEA, Ashley Clements NO.:  [id number]  MEDICAL RECORD NO.:  [id number]  LOCATION:  [facility]                         FACILITY:  [facility]  PHYSICIAN:  Myrtie Neither, MD      DATE OF BIRTH:  April 21, 1946  DATE OF ADMISSION:  04/03/2012 DATE OF DISCHARGE:                             HISTORY & PHYSICAL   CHIEF COMPLAINT:  Painful right knee.  HISTORY OF PRESENT ILLNESS:  This is a 66 year old female being followed in the office for degenerative joint disease involving bilateral knees, the right being worse than the left.  The patient had been treated with therapeutic injections, anti-inflammatories, and Decadron with progressive worsening of symptoms of increased pain.  The patient also has been treated with use of cane.  The patient has increased loss of motion with some giving way and buckling of the right knee more so than the left.  The pain is both at rest as well as on ambulation.  PAST MEDICAL HISTORY:  Hypertension, diabetes mellitus, degenerative joint disease, chronic anemia.  PAST SURGICAL HISTORY:  Cholecystectomy, abdominal hysterectomy, eye surgery on the left cataract and retinal detachment on the left.  ALLERGIES:  None known.  SOCIAL HISTORY:  Negative for use of alcohol, tobacco, or illegal drugs.  FAMILY HISTORY:  High blood pressure, diabetes.  REVIEW OF SYSTEMS:  No cardiac, respiratory, urinary, or bowel symptoms. Basically that of history of present illness.  ALLERGIES:  Latex and use of peanut.  MEDICATIONS:  Aspirin 325 mg a day, calcium with vitamin D 1200 daily, Celebrex 200 mg b.i.d., multivitamin, niacin 500 mg at bedtime, Evista 60 mg daily, ranitidine HCl 1 tab daily, Crestor 20 mg daily, and Diovan/HCTZ 80/12.5 daily.  PHYSICAL EXAMINATION:  GENERAL:  Alert and oriented, no acute distress. Ambulating with slight antalgic gait with use of cane. HEAD:  Normocephalic. EYES:  Sclerae clear. NECK:  Supple. CHEST:  Clear. CARDIAC:   S1 and S2, regular. EXTREMITIES:  Regular knee genu varum.  Crepitus, medial and lateral compartment, +2 effusion.  Tender in both medial and lateral compartment.  Range of motion is good with full flexion.  There is slight flexion contracture.  Negative Homans test.  X-rays revealed loss of joint space both medially and laterally and patellofemoral joint with osteophyte sclerosis.  IMPRESSION:  Degenerative joint disease, right knee.  PLAN:  Right total knee arthroplasty.     Myrtie Neither, MD     AC/MEDQ  D:  04/03/2012  T:  04/03/2012  Job:  [id number]

## 2012-04-04 LAB — CBC
HCT: 28.2 % — ABNORMAL LOW (ref 36.0–46.0)
Hemoglobin: 9.5 g/dL — ABNORMAL LOW (ref 12.0–15.0)
MCHC: 33.7 g/dL (ref 30.0–36.0)
WBC: 6.3 10*3/uL (ref 4.0–10.5)

## 2012-04-04 LAB — PROTIME-INR
INR: 1.39 (ref 0.00–1.49)
Prothrombin Time: 16.7 seconds — ABNORMAL HIGH (ref 11.6–15.2)

## 2012-04-04 LAB — GLUCOSE, CAPILLARY: Glucose-Capillary: 120 mg/dL — ABNORMAL HIGH (ref 70–99)

## 2012-04-04 MED ORDER — WARFARIN SODIUM 5 MG PO TABS
5.0000 mg | ORAL_TABLET | Freq: Once | ORAL | Status: AC
  Administered 2012-04-04: 5 mg via ORAL
  Filled 2012-04-04: qty 1

## 2012-04-04 NOTE — Progress Notes (Signed)
Physical Therapy Treatment Patient Details Name: Ashley Clements MRN: [id number] DOB: [birth year] Today's Date: 04/04/2012 Time: 6578-4696 PT Time Calculation (min): 35 min  PT Assessment / Plan / Recommendation Comments on Treatment Session  66 y.o. female s/p R TKA now POD #1 PM session and she is progressing well despite reports of painful knee.  She is requiring less assist and she was able to walk into the hallway further than the AM session/evaluation.      Follow Up Recommendations  Home health PT        Barriers to Discharge  none      Equipment Recommendations  3 in 1 bedside comode    Recommendations for Other Services  none  Frequency 7X/week   Plan Discharge plan remains appropriate;Frequency remains appropriate    Precautions / Restrictions Precautions Precautions: Knee Precaution Comments: reviewed no pillow precaution Required Braces or Orthoses: Knee Immobilizer - Right Knee Immobilizer - Right: Other (comment) ("maintain KI until discontinued") Restrictions Weight Bearing Restrictions: Yes RLE Weight Bearing: Partial weight bearing RLE Partial Weight Bearing Percentage or Pounds: 50   Pertinent Vitals/Pain Pain 8/10 left knee with gait, PCA encouraged    Mobility  Bed Mobility Supine to Sit: 4: Min assist;HOB elevated;With rails Sitting - Scoot to Edge of Bed: 4: Min assist;With rail Details for Bed Mobility Assistance: verbal cues for 1/2 bridge technique to get into and out of bed.  Min assist needed of right leg.   Transfers Sit to Stand: 5: Supervision;From elevated surface;With upper extremity assist;With armrests;From bed;From chair/3-in-1 Stand to Sit: 5: Supervision;With upper extremity assist;To elevated surface;To bed Details for Transfer Assistance: supervision for safety, verbal cues for safe hand placement and to kick right leg out in front of her while going to sit.   Ambulation/Gait Ambulation/Gait Assistance: 4: Min guard Ambulation  Distance (Feet): 60 Feet Assistive device: Rolling walker Ambulation/Gait Assistance Details: verbal cues for upright posture and to stay closer to RW Gait Pattern: Step-to pattern;Antalgic    Exercises Total Joint Exercises Ankle Circles/Pumps: AROM;Both;20 reps;Supine Quad Sets: AROM;Both;10 reps;Supine Towel Squeeze: AROM;Both;10 reps;Supine Short Arc Quad: AAROM;Right;10 reps;Supine Heel Slides: AAROM;Right;10 reps;Supine Hip ABduction/ADduction: AAROM;Right;10 reps;Supine Straight Leg Raises: AAROM;Right;10 reps;Supine   PT Goals Acute Rehab PT Goals PT Goal: Supine/Side to Sit - Progress: Progressing toward goal PT Goal: Stand to Sit - Progress: Progressing toward goal PT Goal: Ambulate - Progress: Progressing toward goal PT Goal: Perform Home Exercise Program - Progress: Progressing toward goal  Visit Information  Last PT Received On: 04/04/12 Assistance Needed: +1    Subjective Data  Subjective: "I'm not sure I can do this again"   Cognition  Overall Cognitive Status: Appears within functional limits for tasks assessed/performed       End of Session PT - End of Session Equipment Utilized During Treatment: Gait belt;Right knee immobilizer Activity Tolerance: Patient limited by pain Patient left: in bed;with call bell/phone within reach;with family/visitor present CPM Right Knee CPM Right Knee: On Right Knee Flexion (Degrees): 60  Right Knee Extension (Degrees): 0      Sha Burling B. Dellanira Dillow, PT, DPT [phone]   04/04/2012, 4:59 PM

## 2012-04-04 NOTE — Progress Notes (Signed)
CARE MANAGEMENT NOTE 04/04/2012  Patient:  Ashley Clements, Ashley Clements   Account Number:  [id number]  Date Initiated:  04/04/2012  Documentation initiated by:  Vance Peper  Subjective/Objective Assessment:   32 yr. old female s/p right total knee arthroplasty     Action/Plan:   Progression of care and Discharge planninhg. CM spoke with patient regarding home health and DME needs at discharge.Choice offered. Patient has rolling walker. 3in1 and CPm have been ordered.   Anticipated DC Date:  04/05/2012   Anticipated DC Plan:  HOME W HOME HEALTH SERVICES      DC Planning Services  CM consult      PAC Choice  DURABLE MEDICAL EQUIPMENT  HOME HEALTH   Choice offered to / List presented to:  C-1 Patient   DME arranged  3-N-1  CPM        HH arranged  HH-1 RN  HH-2 PT      HH agency  [facility]   Status of service:  Completed, signed off Medicare Important Message given?   (If response is "NO", the following Medicare IM given date fields will be blank) Date Medicare IM given:   Date Additional Medicare IM given:    Discharge Disposition:  HOME W HOME HEALTH SERVICES  Per UR Regulation:    If discussed at Long Length of Stay Meetings, dates discussed:    Comments:

## 2012-04-04 NOTE — Op Note (Signed)
NAMEDORAINE, BRANDIS NO.:  [id number]  MEDICAL RECORD NO.:  [id number]  LOCATION:  5N07C                        FACILITY:  [facility]  PHYSICIAN:  Myrtie Neither, MD      DATE OF BIRTH:  [birth year]  DATE OF PROCEDURE:  04/03/2012 DATE OF DISCHARGE:                              OPERATIVE REPORT   PREOPERATIVE DIAGNOSIS:  Degenerative joint disease, right knee.  POSTOPERATIVE DIAGNOSIS:  Degenerative joint disease, right knee.  ANESTHESIA:  General.  PROCEDURE:  Right total knee arthroplasty, Biomet implant.  The patient was taken to the operating room.  After given adequate preop medications, given general anesthesia and intubated.  Right knee was prepped with DuraPrep and draped in sterile manner.  Tourniquet and Bovie used for hemostasis.  Anterior midline incision made over the right knee extending from the quadriceps down to the tibial tuberosity. Soft tissue dissection was then made exposing paramedial capsule that was extending from the quadriceps down to the tibial tuberosity. Paramedian incision on the capsule was made reflecting the patella laterally, osteophytes about the patella.  Femur and tibia were resected.  The patient has varus deformity.  Soft tissue release was done medially allowing the knee to be brought into neutral slight valgus position.  Knee was then taken up into flexed position.  Tibial bearing surface cutting jig was put in place and 10 mm of proximal tibial surface was resected.  Next reaming down the femoral canal was done followed by placement of the distal femoral cutting jig.  Distal femoral was made followed by sizing which was that of 6 to 7.5 mm.  A 7.5 mm cutting jig was put in place.  Anterior, posterior, and chamfering cuts were then made and other soft tissue and osteophytic resection was done. Trial component was put in place and found to be in very good snug fit. Next attention was turned to the tibia plateau surface  which was measured as a 71 mm.  Trial 10 mm poly was put in place.  Range of motion full extension, full flexion was noted.  Next, a femoral rod for the tibial cut was put in place with a 71 mm jig both in flexed and extended position.  Landmarks were bovied on the tibia.  Guide was removed and then cutting jig was put in place and appropriate tibial bearing surface cuts were made.  Trial components, both the femur as well as the tibia were put in place, 10 mm, still allow some medial and lateral laxity, 12 mm removed the laxity with full flexion, full extension and good stability.  Attention was then turned to the patella which is a size 31 mm small and appropriate cut was made.  With all 3 components in place, 12 mm bearing surface was found to be more stable. There was no abnormal tracking of the patella.  Good medial and lateral stability, full extension and full flexion.  All 3 components were then removed.  The bone was irrigated with the irrigator.  Methylmethacrylate was mixed.  The tibial and patellar components were cemented.  The femoral component was press fitted.  After excess methylmethacrylate was removed in setting of the cement, again range of  motion was found to be good with good patellar tracking.  Tibial bearing surface selected was that of a 12 mm and it was locked in with a key.  Copious irrigation was done.  Tourniquet was let down.  Hemostasis obtained followed by use of 0 Vicryl for the fascia, 2-0 for the subcutaneous, and skin staples for the skin.  Bulky compressive dressing was applied, knee immobilizer applied.  The patient tolerated the procedure quite well and went to recovery room in stable and satisfactory condition.     Myrtie Neither, MD     AC/MEDQ  D:  04/03/2012  T:  04/04/2012  Job:  [id number]

## 2012-04-04 NOTE — Progress Notes (Signed)
[facility]  Patient Status: New  [facility] is providing the following services: RN and PT  Thank you for this referral.  If patient discharges after hours, please call [phone].   Jodene Nam 04/04/2012, 2:58 PM

## 2012-04-04 NOTE — Evaluation (Signed)
Physical Therapy Evaluation Patient Details Name: Ashley Clements MRN: [id number] DOB: 1946-06-02 Today's Date: 04/04/2012 Time: 0834-0900 PT Time Calculation (min): 26 min  PT Assessment / Plan / Recommendation Clinical Impression  Pt is a pleasent and cooperative 66 y.o. female s/p r TKA.  Patient presents with deficits in functional mobility secondary to pain, weakness, ROM limitations and decreased activity tolerance.  Pt will continue to benefit from skilled pt to address deficits and maximize independence for discharge.  Anticipate patient will perform stair negotiation training tomorrow am.  Rec home PT upon discharge.    PT Assessment  Patient needs continued PT services    Follow Up Recommendations  Home health PT    Does the patient have the potential to tolerate intense rehabilitation      Barriers to Discharge Decreased caregiver support Husband may not be able to provide assist if needed    Equipment Recommendations  3 in 1 bedside comode    Recommendations for Other Services     Frequency 7X/week    Precautions / Restrictions Precautions Precautions: Knee Precaution Comments: Explained importance of end range extension/flexion; advised no pillow behind the knee Required Braces or Orthoses: Knee Immobilizer - Right Restrictions Weight Bearing Restrictions: Yes RLE Weight Bearing: Partial weight bearing RLE Partial Weight Bearing Percentage or Pounds: 50   Pertinent Vitals/Pain No numerical value stated; Pt reports increased pain with movement; SpO2 100%      Mobility  Bed Mobility Bed Mobility: Supine to Sit;Sitting - Scoot to Edge of Bed Supine to Sit: 4: Min guard;HOB elevated Sitting - Scoot to [place] of Bed: 4: Min assist;4: Min guard (min assist to control RLE lowering to floor) Details for Bed Mobility Assistance: No VC's needed for technique Transfers Transfers: Sit to Stand;Stand to Sit Sit to Stand: 4: Min guard;From bed;With upper extremity  assist Stand to Sit: 4: Min guard;To chair/3-in-1;With armrests Details for Transfer Assistance: VC's for hand placement and body control  Ambulation/Gait Ambulation/Gait Assistance: 4: Min guard Ambulation Distance (Feet): 18 Feet (to and from bathroom ) Assistive device: Rolling walker Ambulation/Gait Assistance Details: VC's for sequencing and body position within walker Gait Pattern: Step-to pattern;Decreased stride length;Right hip hike;Antalgic;Trunk flexed (hip hike for inability to clear RLE secondary to immobilizer) Gait velocity: decreased General Gait Details: Mild dizziness reported after prolonged standing activity; symptoms resolved upon sitting with rest Stairs: No Wheelchair Mobility Wheelchair Mobility: No       Exercises Total Joint Exercises Ankle Circles/Pumps: AROM;Both;5 reps (advised to continue throughout the day)   PT Diagnosis: Difficulty walking;Abnormality of gait;Generalized weakness;Acute pain  PT Problem List: Decreased strength;Decreased range of motion;Decreased activity tolerance;Decreased mobility;Decreased safety awareness;Pain PT Treatment Interventions: DME instruction;Gait training;Stair training;Functional mobility training;Therapeutic activities;Therapeutic exercise;Patient/family education   PT Goals Acute Rehab PT Goals PT Goal Formulation: With patient Time For Goal Achievement: 04/09/12 Potential to Achieve Goals: Good Pt will go Supine/Side to Sit: with modified independence PT Goal: Supine/Side to Sit - Progress: Goal set today Pt will go Stand to Sit: with modified independence PT Goal: Stand to Sit - Progress: Goal set today Pt will Ambulate: >150 feet;with modified independence;with rolling walker PT Goal: Ambulate - Progress: Goal set today Pt will Go Up / Down Stairs: 1-2 stairs;with modified independence;with rolling walker PT Goal: Up/Down Stairs - Progress: Goal set today Pt will Perform Home Exercise Program:  Independently PT Goal: Perform Home Exercise Program - Progress: Goal set today  Visit Information  Last PT Received On: 04/04/12 Assistance Needed: +1  Subjective Data  Subjective: I have to use the bathroom Patient Stated Goal: to go home   Prior Functioning  Home Living Lives With: Spouse Available Help at Discharge: Family;Available PRN/intermittently (spouse unable to assist) Type of Home: House Home Access: Stairs to enter [employer] of Steps: 1 Entrance Stairs-Rails: None Home Layout: Two level;Able to live on main level with bedroom/bathroom Bathroom Shower/Tub: Tub/shower unit;Door [employer] Toilet: Standard Home Adaptive Equipment: Straight cane;Walker - rolling Prior Function Level of Independence: Independent with assistive device(s) (occassional use of cane) Able to Take Stairs?: Yes Driving: Yes Communication Communication: No difficulties    Cognition  Overall Cognitive Status: Appears within functional limits for tasks assessed/performed Arousal/Alertness: Awake/alert Orientation Level: Appears intact for tasks assessed Behavior During Session: [facility] for tasks performed    Extremity/Trunk Assessment Right Upper Extremity Assessment RUE ROM/Strength/Tone: [facility] for tasks assessed Left Upper Extremity Assessment LUE ROM/Strength/Tone: [facility] for tasks assessed Right Lower Extremity Assessment RLE ROM/Strength/Tone: Deficits;Unable to fully assess;Due to pain;Due to precautions Left Lower Extremity Assessment LLE ROM/Strength/Tone: [facility] for tasks assessed   Balance Balance Balance Assessed: Yes High Level Balance High Level Balance Activites: Side stepping;Backward walking;Turns;Direction changes;Head turns High Level Balance Comments: Performed with steady balance in confined space  End of Session PT - End of Session Equipment Utilized During Treatment: Gait belt;Right knee immobilizer Activity Tolerance: Patient tolerated treatment well;Patient  limited by fatigue;Patient limited by pain Patient left: in chair;with call bell/phone within reach;with family/visitor present Nurse Communication: Mobility status;Other (comment) (advised nsg of urine void post foley) CPM Right Knee CPM Right Knee: Off  GP     Fabio Asa 04/04/2012, 9:30 AM Charlotte Crumb, PT DPT  [phone]

## 2012-04-04 NOTE — Progress Notes (Addendum)
ANTICOAGULATION CONSULT NOTE - Follow up Consult  Pharmacy Consult for Coumadin Indication: VTE prophylaxis  Allergies  Allergen Reactions  . Latex   . Peanut-Containing Drug Products Rash    All nuts    Patient Measurements: Height: 5\' 6"  (167.6 cm) (from 03/24/12 preadmission) Weight: 184 lb (83.462 kg) (from 03/24/12 preadmission) IBW/kg (Calculated) : 59.3  Wt 83.5kg  Vital Signs: Temp: 100 F (37.8 C) (10/11 0600) BP: 95/47 mmHg (10/11 0600) Pulse Rate: 72  (10/11 0600)  Labs:  Basename 04/04/12 0615  HGB 9.5*  HCT 28.2*  PLT 163  APTT --  LABPROT 16.7*  INR 1.39  HEPARINUNFRC --  CREATININE --  CKTOTAL --  CKMB --  TROPONINI --    Estimated Creatinine Clearance: 74.3 ml/min (by C-G formula based on Cr of 0.71).   Assessment: 66 yo female s/p R TKA on 04/03/12 to continue on Coumadin for VTE prophylaxis. Baseline INR of 1.07. INR today is subtherapeutic (1.39) after one dose of 6 mg. Hgb 9.5, plt 163, lower than preop CBC, will follow trend; no bleeding noted per RN.  Educated pt on Coumadin today and provided handout.    Goal of Therapy:  INR 2-3   Plan:  1. Coumadin 5 mg po x 1 today 2. Daily INR   [name] L 04/04/2012,10:11 AM

## 2012-04-04 NOTE — Progress Notes (Signed)
Referral received for SNF. Chart reviewed and CSW has spoken with RNCM who indicates that patient is for DC to home with Home Health and DME.  CSW to sign off. Please re-consult if CSW needs arise.  Leshia Kope T. Freada Twersky, LCSWA  [phone]  

## 2012-04-04 NOTE — Progress Notes (Signed)
Subjective: 1 Day Post-Op Procedure(s) (LRB): TOTAL KNEE ARTHROPLASTY (Right) Patient reports pain as 6 on 0-10 scale.    Objective: Vital signs in last 24 hours: Temp:  [97.5 F (36.4 C)-100 F (37.8 C)] 100 F (37.8 C) (10/11 0600) Pulse Rate:  [51-76] 72  (10/11 0600) Resp:  [12-20] 20  (10/11 0822) BP: (95-127)/(44-73) 95/47 mmHg (10/11 0600) SpO2:  [99 %-100 %] 100 % (10/11 0822) Weight:  [83.462 kg (184 lb)] 83.462 kg (184 lb) (10/10 1700)  Intake/Output from previous day: 10/10 0701 - 10/11 0700 In: 3090 [P.O.:50; I.V.:3040] Out: 3670 [Urine:3620; Blood:50] Intake/Output this shift:     Basename 04/04/12 0615  HGB 9.5*    Basename 04/04/12 0615  WBC 6.3  RBC 3.41*  HCT 28.2*  PLT 163   No results found for this basename: NA:2,K:2,CL:2,CO2:2,BUN:2,CREATININE:2,GLUCOSE:2,CALCIUM:2 in the last 72 hours  Basename 04/04/12 0615  LABPT --  INR 1.39    Neurovascular intact  Assessment/Plan: 1 Day Post-Op Procedure(s) (LRB): TOTAL KNEE ARTHROPLASTY (Right) Up with therapy  Cleota Pellerito F 04/04/2012, 9:44 AM

## 2012-04-05 LAB — CBC
MCH: 27 pg (ref 26.0–34.0)
MCV: 82.5 fL (ref 78.0–100.0)
Platelets: 167 10*3/uL (ref 150–400)
RDW: 14.4 % (ref 11.5–15.5)
WBC: 8.6 10*3/uL (ref 4.0–10.5)

## 2012-04-05 LAB — PROTIME-INR: Prothrombin Time: 19.6 seconds — ABNORMAL HIGH (ref 11.6–15.2)

## 2012-04-05 MED ORDER — CHLORHEXIDINE GLUCONATE 4 % EX LIQD: 60.0000 mL | Freq: Once | CUTANEOUS | Status: DC

## 2012-04-05 MED ORDER — CEFAZOLIN SODIUM-DEXTROSE 2-3 GM-% IV SOLR: 2.0000 g | INTRAVENOUS | Status: DC

## 2012-04-05 MED ORDER — WARFARIN SODIUM 4 MG PO TABS
4.0000 mg | ORAL_TABLET | Freq: Once | ORAL | Status: AC
  Administered 2012-04-05: 4 mg via ORAL
  Filled 2012-04-05: qty 1

## 2012-04-05 NOTE — Progress Notes (Signed)
Physical Therapy Treatment Patient Details Name: Ashley Clements MRN: [id number] DOB: April 18, 1946 Today's Date: 04/05/2012 Time: 4098-1191 PT Time Calculation (min): 26 min  PT Assessment / Plan / Recommendation Comments on Treatment Session  Patient eager to get out of bed and work with therapy. Patient is progressing well and able to increase ambulation and activity tolerance this session.     Follow Up Recommendations  Home health PT     Does the patient have the potential to tolerate intense rehabilitation     Barriers to Discharge        Equipment Recommendations  3 in 1 bedside comode    Recommendations for Other Services    Frequency 7X/week   Plan Discharge plan remains appropriate;Frequency remains appropriate    Precautions / Restrictions Precautions Precautions: Knee Required Braces or Orthoses: Knee Immobilizer - Right Knee Immobilizer - Right: Other (comment) (maintained until discountinued) Restrictions RLE Weight Bearing: Partial weight bearing RLE Partial Weight Bearing Percentage or Pounds: 50   Pertinent Vitals/Pain     Mobility  Bed Mobility Supine to Sit: 5: Supervision;With rails Sitting - Scoot to Edge of Bed: 5: Supervision Transfers Sit to Stand: 4: Min assist;With upper extremity assist;From toilet;From bed Stand to Sit: 4: Min assist;With upper extremity assist;To chair/3-in-1;To toilet Details for Transfer Assistance: A to initate stand and shift weight anteriorly over BOS. Cues for safe technique and to slide out right leg with sitting Ambulation/Gait Ambulation/Gait Assistance: 4: Min guard Ambulation Distance (Feet): 100 Feet Assistive device: Rolling walker Ambulation/Gait Assistance Details: Cues for gait sequence and to stand upright Gait Pattern: Step-through pattern;Antalgic    Exercises Total Joint Exercises Ankle Circles/Pumps: AROM;Both;15 reps Quad Sets: AROM;Both;15 reps Short Arc Quad: AAROM;10 reps;Right Heel Slides:  AAROM;Right;15 reps Hip ABduction/ADduction: AAROM;Right;15 reps Long Arc Quad: AROM;Right;10 reps   PT Diagnosis:    PT Problem List:   PT Treatment Interventions:     PT Goals Acute Rehab PT Goals PT Goal: Supine/Side to Sit - Progress: Progressing toward goal PT Goal: Stand to Sit - Progress: Progressing toward goal PT Goal: Ambulate - Progress: Progressing toward goal PT Goal: Perform Home Exercise Program - Progress: Progressing toward goal  Visit Information  Last PT Received On: 04/05/12 Assistance Needed: +1    Subjective Data      Cognition  Overall Cognitive Status: Appears within functional limits for tasks assessed/performed Arousal/Alertness: Awake/alert Orientation Level: Appears intact for tasks assessed Behavior During Session: [facility] for tasks performed    Balance     End of Session PT - End of Session Equipment Utilized During Treatment: Gait belt;Right knee immobilizer Activity Tolerance: Patient tolerated treatment well Patient left: in chair;with call bell/phone within reach Nurse Communication: Mobility status   GP     Fredrich Birks 04/05/2012, 11:03 AM 04/05/2012 Fredrich Birks PTA [phone] pager [phone] office

## 2012-04-05 NOTE — Progress Notes (Signed)
Subjective: 2 Days Post-Op Procedure(s) (LRB): TOTAL KNEE ARTHROPLASTY (Right) Patient reports pain as 4 on 0-10 scale.    Objective: Vital signs in last 24 hours: Temp:  [98 F (36.7 C)-100.7 F (38.2 C)] 98 F (36.7 C) (10/12 0526) Pulse Rate:  [73-90] 80  (10/12 0526) Resp:  [18-20] 18  (10/12 0800) BP: (105-124)/(45-55) 124/55 mmHg (10/12 0526) SpO2:  [97 %-100 %] 100 % (10/12 0800)  Intake/Output from previous day: 10/11 0701 - 10/12 0700 In: 600 [P.O.:360; I.V.:240] Out: -  Intake/Output this shift:     Basename 04/05/12 0630 04/04/12 0615  HGB 9.4* 9.5*    Basename 04/05/12 0630 04/04/12 0615  WBC 8.6 6.3  RBC 3.48* 3.41*  HCT 28.7* 28.2*  PLT 167 163   No results found for this basename: NA:2,K:2,CL:2,CO2:2,BUN:2,CREATININE:2,GLUCOSE:2,CALCIUM:2 in the last 72 hours  Basename 04/05/12 0630 04/04/12 0615  LABPT -- --  INR 1.72* 1.39    Neurovascular intact  Assessment/Plan: 2 Days Post-Op Procedure(s) (LRB): TOTAL KNEE ARTHROPLASTY (Right) Up with therapy  Keion Neels F 04/05/2012, 10:08 AM

## 2012-04-05 NOTE — Progress Notes (Signed)
ANTICOAGULATION CONSULT NOTE - Follow up Consult  Pharmacy Consult for Coumadin Indication: VTE prophylaxis  Allergies  Allergen Reactions  . Latex   . Peanut-Containing Drug Products Rash    All nuts    Patient Measurements: Height: 5\' 6"  (167.6 cm) (from 03/24/12 preadmission) Weight: 184 lb (83.462 kg) (from 03/24/12 preadmission) IBW/kg (Calculated) : 59.3  Wt 83.5kg  Vital Signs: Temp: 98 F (36.7 C) (10/12 0526) BP: 124/55 mmHg (10/12 0526) Pulse Rate: 80  (10/12 0526)  Labs:  Basename 04/05/12 0630 04/04/12 0615  HGB 9.4* 9.5*  HCT 28.7* 28.2*  PLT 167 163  APTT -- --  LABPROT 19.6* 16.7*  INR 1.72* 1.39  HEPARINUNFRC -- --  CREATININE -- --  CKTOTAL -- --  CKMB -- --  TROPONINI -- --    Estimated Creatinine Clearance: 74.3 ml/min (by C-G formula based on Cr of 0.71).   Assessment: 67 yo female s/p R TKA on 04/03/12 to continue on Coumadin for VTE prophylaxis. Baseline INR of 1.07. INR today is subtherapeutic (1.72) but increasing towards goal after one dose of 6 mg and one dose of 5 mg. Likely have not seen full effect of all doses.  Hgb 9.4, plt 167, about stable from yesterday, will follow trend.    Goal of Therapy:  INR 2-3   Plan:  1. Coumadin 4 mg po x 1 today 2. Daily INR   [name] L 04/05/2012,7:57 AM

## 2012-04-05 NOTE — Progress Notes (Signed)
Physical Therapy Treatment Patient Details Name: Ashley Clements MRN: [id number] DOB: 08/23/1945 Today's Date: 04/05/2012 Time: 4098-1191 PT Time Calculation (min): 25 min  PT Assessment / Plan / Recommendation Comments on Treatment Session  Patient continuing to pogress with therapy and able to increase ambulation this session. Patient able to stand/sit this session without physical assistance. Continue with current POC    Follow Up Recommendations  Home health PT     Does the patient have the potential to tolerate intense rehabilitation     Barriers to Discharge        Equipment Recommendations  3 in 1 bedside comode    Recommendations for Other Services    Frequency 7X/week   Plan Discharge plan remains appropriate;Frequency remains appropriate    Precautions / Restrictions Precautions Precautions: Knee Required Braces or Orthoses: Knee Immobilizer - Right Knee Immobilizer - Right: Other (comment) (maintain until discontinued) Restrictions Weight Bearing Restrictions: Yes RLE Weight Bearing: Partial weight bearing RLE Partial Weight Bearing Percentage or Pounds: 50   Pertinent Vitals/Pain     Mobility  Bed Mobility Bed Mobility: Not assessed Supine to Sit: 5: Supervision;With rails Sitting - Scoot to Edge of Bed: 5: Supervision Transfers Sit to Stand: 4: Min guard;With upper extremity assist;From chair/3-in-1 Stand to Sit: 4: Min guard;To chair/3-in-1;With upper extremity assist Details for Transfer Assistance: Patient stood x2 with better control this afternoon with increased ability to slide out R LE  Ambulation/Gait Ambulation/Gait Assistance: 4: Min guard Ambulation Distance (Feet): 150 Feet Assistive device: Rolling walker Ambulation/Gait Assistance Details: Patient with good posture and heel strike on R foot. Patient limited by fatique with ambulation but progressing well.  Gait Pattern: Step-to pattern;Decreased step length - right;Decreased step length -  left    Exercises Total Joint Exercises Ankle Circles/Pumps: AROM;Both;15 reps Quad Sets: AROM;Both;15 reps Short Arc Quad: AAROM;10 reps;Right Heel Slides: AAROM;Right;15 reps Hip ABduction/ADduction: 15 reps;Right;AAROM Straight Leg Raises: AAROM;Right;10 reps Long Arc Quad: AROM;Right;10 reps   PT Diagnosis:    PT Problem List:   PT Treatment Interventions:     PT Goals Acute Rehab PT Goals PT Goal: Supine/Side to Sit - Progress: Progressing toward goal PT Goal: Stand to Sit - Progress: Progressing toward goal PT Goal: Ambulate - Progress: Progressing toward goal PT Goal: Perform Home Exercise Program - Progress: Progressing toward goal  Visit Information  Last PT Received On: 04/05/12 Assistance Needed: +1    Subjective Data      Cognition  Overall Cognitive Status: Appears within functional limits for tasks assessed/performed Arousal/Alertness: Awake/alert Orientation Level: Appears intact for tasks assessed Behavior During Session: [facility] for tasks performed    Balance  Balance Balance Assessed: Yes Dynamic Standing Balance Dynamic Standing - Level of Assistance: 4: Min assist  End of Session PT - End of Session Equipment Utilized During Treatment: Gait belt;Right knee immobilizer Activity Tolerance: Patient tolerated treatment well Patient left: in chair;with call bell/phone within reach Nurse Communication: Mobility status   GP     Fredrich Birks 04/05/2012, 2:05 PM 04/05/2012 Fredrich Birks PTA [phone] pager [phone] office

## 2012-04-05 NOTE — Evaluation (Signed)
Occupational Therapy Evaluation Patient Details Name: Ashley Clements MRN: [id number] DOB: [birth year] Today's Date: 04/05/2012 Time: 4098-1191 OT Time Calculation (min): 33 min  OT Assessment / Plan / Recommendation Clinical Impression  Pt is s/p R TKA and displays decreased strength, balance and increased pain. Would benefit from skilled OT services to improve independence and safety with ADl for d/c home.    OT Assessment  Patient needs continued OT Services    Follow Up Recommendations  Supervision/Assistance - 24 hour;No OT follow up    Barriers to Discharge      Equipment Recommendations  3 in 1 bedside comode    Recommendations for Other Services    Frequency  Min 2X/week    Precautions / Restrictions Precautions Precautions: Knee Required Braces or Orthoses: Knee Immobilizer - Right Knee Immobilizer - Right: Other (comment) (maintained until discontinued) Restrictions Weight Bearing Restrictions: Yes RLE Weight Bearing: Partial weight bearing RLE Partial Weight Bearing Percentage or Pounds: 50        ADL  Eating/Feeding: Simulated;Independent Where Assessed - Eating/Feeding: Chair Grooming: Performed;Teeth care;Min guard Where Assessed - Grooming: Unsupported standing Upper Body Bathing: Performed;Chest;Right arm;Left arm;Abdomen;Set up Where Assessed - Upper Body Bathing: Unsupported sitting Lower Body Bathing: Performed;Minimal assistance Where Assessed - Lower Body Bathing: Supported sit to stand Upper Body Dressing: Simulated;Set up Where Assessed - Upper Body Dressing: Unsupported sitting Lower Body Dressing: Simulated;Minimal assistance Where Assessed - Lower Body Dressing: Supported sit to stand Toilet Transfer: Performed;Minimal Web designer: Raised toilet seat with arms (or 3-in-1 over toilet) Toileting - Clothing Manipulation and Hygiene: Simulated;Minimal assistance Where Assessed - Engineer, mining and  Hygiene: Sit to stand from 3-in-1 or toilet Tub/Shower Transfer Method: Not assessed Equipment Used: Rolling walker ADL Comments: Pt able to reach down to R foot to wash fairly well. Needs min assist for balance to wash periareas in standing or manage clothing. Educated on KI wear and how to don/doff. Also went over tubbench versus chair option and pt to decide whether she wants to sponge versus obtain DME.    OT Diagnosis: Generalized weakness;Acute pain  OT Problem List: Decreased strength;Impaired balance (sitting and/or standing);Decreased knowledge of use of DME or AE;Pain OT Treatment Interventions: Self-care/ADL training;Therapeutic activities;DME and/or AE instruction;Patient/family education   OT Goals Acute Rehab OT Goals OT Goal Formulation: With patient Time For Goal Achievement: 04/12/12 Potential to Achieve Goals: Good ADL Goals Pt Will Perform Grooming: with supervision;Standing at sink ADL Goal: Grooming - Progress: Goal set today Pt Will Perform Lower Body Bathing: with supervision;Sit to stand from chair;Sit to stand from bed ADL Goal: Lower Body Bathing - Progress: Goal set today Pt Will Perform Lower Body Dressing: with supervision;Sit to stand from bed;Sit to stand from chair ADL Goal: Lower Body Dressing - Progress: Goal set today Pt Will Transfer to Toilet: with supervision;Ambulation;with DME;3-in-1 ADL Goal: Toilet Transfer - Progress: Goal set today Pt Will Perform Toileting - Clothing Manipulation: with supervision;Standing ADL Goal: Toileting - Clothing Manipulation - Progress: Goal set today Pt Will Perform Tub/Shower Transfer: Tub transfer;with min assist;with DME;Transfer tub bench;Other (comment) (if pt decides on seat/bench versus sponge initially) ADL Goal: Tub/Shower Transfer - Progress: Goal set today  Visit Information  Last OT Received On: 04/05/12 Assistance Needed: +1    Subjective Data  Subjective: I want to finish up my bath and brush  teeth Patient Stated Goal: home when ready   Prior Functioning     Home Living Lives With: Spouse Available Help at  Discharge: Family;Available PRN/intermittently (spouse unable to assist) Type of Home: House Home Access: Stairs to enter [employer] of Steps: 1 Entrance Stairs-Rails: None Home Layout: Two level;Able to live on main level with bedroom/bathroom Bathroom Shower/Tub: Tub/shower unit;Door [employer] Toilet: Standard Home Adaptive Equipment: Straight cane;Walker - rolling Prior Function Level of Independence: Independent with assistive device(s) (occassional use of cane) Able to Take Stairs?: Yes Driving: Yes Communication Communication: No difficulties         Vision/Perception     Cognition  Overall Cognitive Status: Appears within functional limits for tasks assessed/performed Arousal/Alertness: Awake/alert Orientation Level: Appears intact for tasks assessed Behavior During Session: [facility] for tasks performed    Extremity/Trunk Assessment Right Upper Extremity Assessment RUE ROM/Strength/Tone: [facility] for tasks assessed Left Upper Extremity Assessment LUE ROM/Strength/Tone: WFL for tasks assessed     Mobility Bed Mobility Supine to Sit: 5: Supervision;With rails Sitting - Scoot to Edge of Bed: 5: Supervision Transfers Transfers: Sit to Stand;Stand to Sit Sit to Stand: 4: Min assist;With upper extremity assist;From chair/3-in-1 Stand to Sit: 4: Min assist;With upper extremity assist;To chair/3-in-1 Details for Transfer Assistance: min verbal cues for hand placement and overall safety and assist to stabilize as she rises and then to control descent.     Shoulder Instructions        Balance Balance Balance Assessed: Yes Dynamic Standing Balance Dynamic Standing - Level of Assistance: 4: Min assist   End of Session OT - End of Session Activity Tolerance: Patient tolerated treatment well Patient left: in chair;with call bell/phone within  reach;with family/visitor present  GO     Lennox Laity [phone] 04/05/2012, 12:05 PM

## 2012-04-06 LAB — CBC
HCT: 29.2 % — ABNORMAL LOW (ref 36.0–46.0)
Hemoglobin: 9.7 g/dL — ABNORMAL LOW (ref 12.0–15.0)
MCH: 27.7 pg (ref 26.0–34.0)
MCHC: 33.2 g/dL (ref 30.0–36.0)
MCV: 83.4 fL (ref 78.0–100.0)
Platelets: 186 K/uL (ref 150–400)
RBC: 3.5 MIL/uL — ABNORMAL LOW (ref 3.87–5.11)
RDW: 14.5 % (ref 11.5–15.5)
WBC: 9.3 K/uL (ref 4.0–10.5)

## 2012-04-06 LAB — PROTIME-INR
INR: 1.63 — ABNORMAL HIGH (ref 0.00–1.49)
Prothrombin Time: 18.8 seconds — ABNORMAL HIGH (ref 11.6–15.2)

## 2012-04-06 MED ORDER — WARFARIN SODIUM 6 MG PO TABS
6.0000 mg | ORAL_TABLET | Freq: Once | ORAL | Status: AC
  Administered 2012-04-06: 6 mg via ORAL
  Filled 2012-04-06: qty 1

## 2012-04-06 NOTE — Progress Notes (Signed)
Physical Therapy Treatment Patient Details Name: Ashley Clements MRN: [id number] DOB: December 26, 1945 Today's Date: 04/06/2012 Time: 9629-5284 PT Time Calculation (min): 23 min  PT Assessment / Plan / Recommendation Comments on Treatment Session  Pt continuing to progress with mobility.  Continue with POC.    Follow Up Recommendations  Home health PT     Does the patient have the potential to tolerate intense rehabilitation     Barriers to Discharge        Equipment Recommendations  3 in 1 bedside comode    Recommendations for Other Services    Frequency 7X/week   Plan Discharge plan remains appropriate;Frequency remains appropriate    Precautions / Restrictions Precautions Precautions: Knee Precaution Comments: reviewed no pillow precaution Required Braces or Orthoses: Knee Immobilizer - Right Knee Immobilizer - Right: Other (comment) (maintain until discontinued) Restrictions Weight Bearing Restrictions: Yes RLE Weight Bearing: Partial weight bearing RLE Partial Weight Bearing Percentage or Pounds: 50   Pertinent Vitals/Pain No specific c/o during treatment.    Mobility  Bed Mobility Bed Mobility: Not assessed Transfers Transfers: Sit to Stand;Stand to Sit Sit to Stand: 5: Supervision;With upper extremity assist;From chair/3-in-1 Stand to Sit: 5: Supervision;With upper extremity assist;To chair/3-in-1 Details for Transfer Assistance: cues for hand placement and to slide RLE out Ambulation/Gait Ambulation/Gait Assistance: 4: Min guard Ambulation Distance (Feet): 150 Feet Assistive device: Rolling walker Gait Pattern: Step-to pattern;Decreased step length - right;Decreased step length - left Gait velocity: decreased Stairs: Yes Stairs Assistance: 4: Min guard Stair Management Technique: No rails;Backwards;With walker Number of Stairs: 1  Wheelchair Mobility Wheelchair Mobility: No    Exercises Total Joint Exercises Ankle Circles/Pumps: AROM;Both;15 reps Long  Arc Quad: AROM;Right;10 reps;Seated Knee Flexion: AAROM;Right;10 reps;Seated       PT Goals Acute Rehab PT Goals PT Goal: Stand to Sit - Progress: Progressing toward goal PT Goal: Ambulate - Progress: Progressing toward goal PT Goal: Up/Down Stairs - Progress: Progressing toward goal PT Goal: Perform Home Exercise Program - Progress: Progressing toward goal  Visit Information  Last PT Received On: 04/06/12 Assistance Needed: +1    Subjective Data      Cognition  Overall Cognitive Status: Appears within functional limits for tasks assessed/performed Arousal/Alertness: Awake/alert Orientation Level: Appears intact for tasks assessed Behavior During Session: [facility] for tasks performed    Balance  Balance Balance Assessed: No  End of Session PT - End of Session Equipment Utilized During Treatment: Gait belt;Right knee immobilizer Activity Tolerance: Patient tolerated treatment well Patient left: in chair;with call bell/phone within reach Nurse Communication: Mobility status    Newell Coral 04/06/2012, 12:56 PM  Newell Coral, PTA Acute Rehab [phone] (office)

## 2012-04-06 NOTE — Progress Notes (Signed)
ANTICOAGULATION CONSULT NOTE - Follow up Consult  Pharmacy Consult for Coumadin Indication: VTE prophylaxis  Allergies  Allergen Reactions  . Latex   . Peanut-Containing Drug Products Rash    All nuts    Patient Measurements: Height: 5\' 6"  (167.6 cm) (from 03/24/12 preadmission) Weight: 184 lb (83.462 kg) (from 03/24/12 preadmission) IBW/kg (Calculated) : 59.3  Wt 83.5kg  Vital Signs: Temp: 97.8 F (36.6 C) (10/13 0629) BP: 103/49 mmHg (10/13 0629) Pulse Rate: 81  (10/13 0629)  Labs:  Basename 04/06/12 0630 04/05/12 0630 04/04/12 0615  HGB 9.7* 9.4* --  HCT 29.2* 28.7* 28.2*  PLT 186 167 163  APTT -- -- --  LABPROT 18.8* 19.6* 16.7*  INR 1.63* 1.72* 1.39  HEPARINUNFRC -- -- --  CREATININE -- -- --  CKTOTAL -- -- --  CKMB -- -- --  TROPONINI -- -- --    Estimated Creatinine Clearance: 74.3 ml/min (by C-G formula based on Cr of 0.71).   Assessment: 66 yo female s/p R TKA on 04/03/12 to continue on Coumadin for VTE prophylaxis. Baseline INR of 1.07. INR today is subtherapeutic (1.63).  CBC stable compared to past two days.    Goal of Therapy:  INR 2-3   Plan:  1. Coumadin 6 mg po x 1 today 2. Daily INR   [name] L 04/06/2012,10:15 AM

## 2012-04-06 NOTE — Progress Notes (Signed)
04/06/12 1400  PT Visit Information  Last PT Received On 04/06/12  Assistance Needed +1  PT Time Calculation  PT Start Time 1400  PT Stop Time 1418  PT Time Calculation (min) 18 min  Subjective Data  Subjective "Do we have to do it?  I walked earlier."  Precautions  Precautions Knee  Required Braces or Orthoses Knee Immobilizer - Right  Knee Immobilizer - Right Other (comment) (maintain until discontinued)  Restrictions  Weight Bearing Restrictions Yes  RLE Weight Bearing PWB  RLE Partial Weight Bearing Percentage or Pounds 50  Cognition  Overall Cognitive Status Appears within functional limits for tasks assessed/performed  Arousal/Alertness Awake/alert  Orientation Level Appears intact for tasks assessed  Behavior During Session [facility] for tasks performed  Bed Mobility  Bed Mobility Sit to Supine;Scooting to HOB  Sit to Supine 4: Min assist;HOB flat  Scooting to [facility] 5: Set up  Details for Bed Mobility Assistance min assist needed for R LE to supine  Transfers  Transfers Sit to Stand;Stand to Sit  Sit to Stand 5: Supervision;With upper extremity assist;From chair/3-in-1  Stand to Sit 5: Supervision;With upper extremity assist;To bed  Details for Transfer Assistance cues for hand placement and to slide RLE out  Ambulation/Gait  Ambulation/Gait Assistance 5: Supervision  Ambulation Distance (Feet) 175 Feet  Assistive device Rolling walker  Gait Pattern Step-through pattern  Stairs No  PT - End of Session  Equipment Utilized During Treatment Gait belt;Right knee immobilizer  Activity Tolerance Patient limited by fatigue  Patient left in bed;with call bell/phone within reach;with family/visitor present  Nurse Communication Mobility status  PT - Assessment/Plan  Comments on Treatment Session Pt fatiqued and requesting to return to bed after ambulation.  PT Plan Discharge plan remains appropriate;Frequency remains appropriate  PT Frequency 7X/week  Follow Up Recommendations  Home health PT  Equipment Recommended 3 in 1 bedside comode  Acute Rehab PT Goals  PT Goal: Stand to Sit - Progress Progressing toward goal  PT Goal: Ambulate - Progress Progressing toward goal  PT General Charges  $$ ACUTE PT VISIT 1 Procedure  PT Treatments  $Gait Training 8-22 mins    Newell Coral, [place] Acute Rehab [phone] (office)

## 2012-04-06 NOTE — Progress Notes (Signed)
Pt c/o pain. Medicated as ordered.

## 2012-04-06 NOTE — Progress Notes (Signed)
Occupational Therapy Treatment Patient Details Name: Ashley Clements MRN: [id number] DOB: 1945/09/28 Today's Date: 04/06/2012 Time: 4098-1191 OT Time Calculation (min): 13 min  OT Assessment / Plan / Recommendation Comments on Treatment Session Pt. doing well with BADLs.  Pt. will sponge bathe at discharge rather than purchasing tub DME    Follow Up Recommendations  Supervision/Assistance - 24 hour;No OT follow up    Barriers to Discharge       Equipment Recommendations  3 in 1 bedside comode    Recommendations for Other Services    Frequency Min 2X/week   Plan Discharge plan remains appropriate    Precautions / Restrictions Precautions Precautions: Knee Precaution Comments: reviewed no pillow precaution Required Braces or Orthoses: Knee Immobilizer - Right Knee Immobilizer - Right: Other (comment) Restrictions Weight Bearing Restrictions: Yes RLE Weight Bearing: Partial weight bearing RLE Partial Weight Bearing Percentage or Pounds: 50       ADL  Grooming: Performed;Wash/dry hands;Supervision/safety Where Assessed - Grooming: Supported standing Lower Body Dressing: Performed;Supervision/safety Where Assessed - Lower Body Dressing: Supported sit to Pharmacist, hospital: Research scientist (life sciences) Method: Sit to Barista: Raised toilet seat with arms (or 3-in-1 over toilet) Toileting - Clothing Manipulation and Hygiene: Performed;Supervision/safety Where Assessed - Glass blower/designer Manipulation and Hygiene: Standing Equipment Used: Rolling walker Transfers/Ambulation Related to ADLs: Pt. ambulated in room with supervision ADL Comments: Pt. able to don/doff Rt. sock.  Pt. instructed in use of 3-in1 commode.  Discussed options for tub transfer and DME with pt.  She reports she will sponge bathe until she is able to step into tub    OT Diagnosis:    OT Problem List:   OT Treatment Interventions:     OT Goals ADL Goals ADL  Goal: Grooming - Progress: Met ADL Goal: Lower Body Bathing - Progress: Progressing toward goals ADL Goal: Lower Body Dressing - Progress: Met ADL Goal: Toilet Transfer - Progress: Met ADL Goal: Toileting - Clothing Manipulation - Progress: Met ADL Goal: Tub/Shower Transfer - Progress: Discontinued (comment)  Visit Information  Last OT Received On: 04/06/12 Assistance Needed: +1    Subjective Data      Prior Functioning       Cognition  Overall Cognitive Status: Appears within functional limits for tasks assessed/performed Arousal/Alertness: Awake/alert Orientation Level: Appears intact for tasks assessed Behavior During Session: [facility] for tasks performed    Mobility  Shoulder Instructions Transfers Transfers: Sit to Stand;Stand to Sit Sit to Stand: 5: Supervision;With upper extremity assist;From chair/3-in-1 Stand to Sit: 5: Supervision;With upper extremity assist;To chair/3-in-1 Details for Transfer Assistance: pt with difficulty sliding Rt. leg out       Exercises      Balance     End of Session OT - End of Session Equipment Utilized During Treatment: Right knee immobilizer Activity Tolerance: Patient tolerated treatment well Patient left: in chair;with call bell/phone within reach  GO     Ashley Clements 04/06/2012, 11:08 AM

## 2012-04-06 NOTE — Progress Notes (Signed)
Subjective: 3 Days Post-Op Procedure(s) (LRB): TOTAL KNEE ARTHROPLASTY (Right) Patient reports pain as 3 on 0-10 scale.    Objective: Vital signs in last 24 hours: Temp:  [97.5 F (36.4 C)-99 F (37.2 C)] 97.8 F (36.6 C) (10/13 0629) Pulse Rate:  [78-92] 81  (10/13 0629) Resp:  [16-18] 18  (10/13 0800) BP: (103-126)/(44-64) 103/49 mmHg (10/13 0629) SpO2:  [97 %-100 %] 100 % (10/13 0800)  Intake/Output from previous day: 10/12 0701 - 10/13 0700 In: 600 [P.O.:600] Out: -  Intake/Output this shift:     Basename 04/06/12 0630 04/05/12 0630 04/04/12 0615  HGB 9.7* 9.4* 9.5*    Basename 04/06/12 0630 04/05/12 0630  WBC 9.3 8.6  RBC 3.50* 3.48*  HCT 29.2* 28.7*  PLT 186 167   No results found for this basename: NA:2,K:2,CL:2,CO2:2,BUN:2,CREATININE:2,GLUCOSE:2,CALCIUM:2 in the last 72 hours  Basename 04/06/12 0630 04/05/12 0630  LABPT -- --  INR 1.63* 1.72*    Neurovascular intact  Assessment/Plan: 3 Days Post-Op Procedure(s) (LRB): TOTAL KNEE ARTHROPLASTY (Right) Plan for discharge tomorrow Discharge home with home health  Byard Carranza F 04/06/2012, 1:10 PM

## 2012-04-07 ENCOUNTER — Encounter (HOSPITAL_COMMUNITY): Payer: Self-pay | Admitting: Orthopedic Surgery

## 2012-04-07 LAB — PROTIME-INR
INR: 1.85 — ABNORMAL HIGH (ref 0.00–1.49)
Prothrombin Time: 20.7 seconds — ABNORMAL HIGH (ref 11.6–15.2)

## 2012-04-07 MED ORDER — METHOCARBAMOL 500 MG PO TABS: 500.0000 mg | ORAL_TABLET | Freq: Four times a day (QID) | ORAL | Status: DC | PRN

## 2012-04-07 MED ORDER — FERROUS SULFATE 325 (65 FE) MG PO TABS: 325.0000 mg | ORAL_TABLET | Freq: Three times a day (TID) | ORAL | Status: DC

## 2012-04-07 MED ORDER — WARFARIN SODIUM 2 MG PO TABS: 6.0000 mg | ORAL_TABLET | Freq: Every day | ORAL | Status: DC

## 2012-04-07 MED ORDER — WARFARIN SODIUM 6 MG PO TABS
6.0000 mg | ORAL_TABLET | Freq: Once | ORAL | Status: DC
  Filled 2012-04-07: qty 1

## 2012-04-07 MED ORDER — OXYCODONE HCL 5 MG PO TABS: 5.0000 mg | ORAL_TABLET | ORAL | Status: DC | PRN

## 2012-04-07 NOTE — Progress Notes (Signed)
04/07/12 1000  PT Visit Information  Last PT Received On 04/07/12  Assistance Needed +1  PT Time Calculation  PT Start Time 1010  PT Stop Time 1040  PT Time Calculation (min) 30 min  Subjective Data  Subjective "I'm ready to go."  Precautions  Precautions Knee  Knee Immobilizer - Right Other (comment) (Discontinued per pt)  Restrictions  Weight Bearing Restrictions Yes  RLE Weight Bearing PWB  RLE Partial Weight Bearing Percentage or Pounds 50  Cognition  Overall Cognitive Status Appears within functional limits for tasks assessed/performed  Arousal/Alertness Awake/alert  Orientation Level Appears intact for tasks assessed  Behavior During Session [facility] for tasks performed  Bed Mobility  Bed Mobility Supine to Sit;Sitting - Scoot to Edge of Bed  Supine to Sit 7: Independent  Sitting - Scoot to Edge of Bed 7: Independent  Transfers  Transfers Sit to Stand;Stand to Sit  Sit to Stand 5: Supervision;With upper extremity assist;From chair/3-in-1  Stand to Sit 5: Supervision;With upper extremity assist;To bed  Details for Transfer Assistance still requiring cues for sit-stand hand placement  Ambulation/Gait  Ambulation/Gait Assistance 6: Modified independent (Device/Increase time)  Ambulation Distance (Feet) 150 Feet  Assistive device Rolling walker  Ambulation/Gait Assistance Details gait steady  Gait Pattern Step-through pattern  Stairs No  Wheelchair Mobility  Wheelchair Mobility No  Balance  Balance Assessed No  Exercises  Exercises Total Joint  Total Joint Exercises  Ankle Circles/Pumps AROM;Both;15 reps  Quad Sets AROM;Right;10 reps;Seated  Towel Squeeze AROM;Both;10 reps;Seated  Short Arc Quad AAROM;AROM;Right;10 reps;Seated  Hip ABduction/ADduction AAROM;Right;5 reps;Seated  Straight Leg Raises AAROM;Right;5 reps;Seated  PT - End of Session  Equipment Utilized During Treatment Gait belt  Activity Tolerance Patient tolerated treatment well  Patient left in  chair;with call bell/phone within reach;with nursing in room;with family/visitor present  Nurse Communication Mobility status  PT - Assessment/Plan  Comments on Treatment Session Pt and daughter educated on car transfer.  Pt being d/c'd by md.  Pt and daughter state no further concerns or questions for Acute PT.  Pt with improved mobility, especially gait over past several days.  PT Plan Discharge plan remains appropriate;Frequency remains appropriate  PT Frequency 7X/week  Follow Up Recommendations Home health PT  Equipment Recommended 3 in 1 bedside comode  Acute Rehab PT Goals  PT Goal: Supine/Side to Sit - Progress Met  PT Goal: Stand to Sit - Progress Progressing toward goal  PT Goal: Ambulate - Progress Met  PT Goal: Perform Home Exercise Program - Progress Progressing toward goal  PT General Charges  $$ ACUTE PT VISIT 1 Procedure  PT Treatments  $Gait Training 8-22 mins  $Therapeutic Exercise 8-22 mins   Newell Coral, PTA Acute Rehab [phone] (office)

## 2012-04-07 NOTE — Discharge Summary (Signed)
Ashley Clements, [name] NO.:  [id number]  MEDICAL RECORD NO.:  [id number]  LOCATION:  5N07C                        FACILITY:  [facility]  PHYSICIAN:  Myrtie Neither, MD      DATE OF BIRTH:  05-11-1946  DATE OF ADMISSION:  04/03/2012 DATE OF DISCHARGE:  04/07/2012                              DISCHARGE SUMMARY   ADMITTING DIAGNOSIS:  Degenerative joint disease, right knee.  DISCHARGE DIAGNOSIS:  Degenerative joint disease, right knee.  COMPLICATIONS:  None.  INFECTIONS:  None.  OPERATION:  Right total knee arthroplasty.  PERTINENT HISTORY:  This is a 66 year old female followed in the office for degenerative joint disease of both knees, right being worse than the left.  The patient has progressive loss of function, increased pain, and very little response to medication.  Pertinent physical was that of the right knee genu varum.  Crepitus to both medial and lateral compartment, patellofemoral joint, +2 effusion. Range of motion is good, but lacks full extension.  Negative Homans test.  Neurovascular status is intact.  X-rays demonstrate loss of joint space with sclerosis, osteophytes, bilateral compartments.  HOSPITAL COURSE:  The patient underwent preop laboratory, CBC, EKG, chest x-ray, PT, PTT, INR, UA, CMET.  The patient's laboratory was stable enough to undergo surgery.  The patient underwent right total knee arthroplasty, tolerated the procedure quite well.  Postoperative course was fairly benign, H and H remains stable and afebrile.  Pain was brought under control with use of p.o. Percocet.  The patient had physical therapy, occupational therapy, partial weightbearing on the right side, uses CPM.  The patient has gotten up to 80 degrees of flexion and -15 of extension.  Wound is healing quite well, afebrile, no sign of infection.  The patient is being discharged home.  Discontinued on her regular medications.  Hydrochlorothiazide 12.5 mg,  Avapro, multivitamins, raloxifene, atorvastatin.  The patient is also on Coumadin 6 mg daily, Percocet 1-2 q.4 hours p.r.n. for pain, Robaxin 500 mg q.6 hours p.r.n., and ferrous sulfate 325 mg t.i.d.  The patient is having home health with physical therapy, scheduled to return to the office in 1 week.  The patient being discharged in stable and satisfactory condition.     Myrtie Neither, MD     AC/MEDQ  D:  04/07/2012  T:  04/07/2012  Job:  [id number]

## 2012-04-07 NOTE — Progress Notes (Signed)
ANTICOAGULATION CONSULT NOTE - Follow up Consult  Pharmacy Consult for Coumadin Indication: VTE prophylaxis  Allergies  Allergen Reactions  . Latex   . Peanut-Containing Drug Products Rash    All nuts    Patient Measurements: Height: 5\' 6"  (167.6 cm) (from 03/24/12 preadmission) Weight: 184 lb (83.462 kg) (from 03/24/12 preadmission) IBW/kg (Calculated) : 59.3  Wt 83.5kg  Vital Signs: Temp: 98.5 F (36.9 C) (10/14 0704) BP: 115/55 mmHg (10/14 0704) Pulse Rate: 72  (10/14 0704)  Labs:  Basename 04/07/12 0610 04/06/12 0630 04/05/12 0630  HGB -- 9.7* 9.4*  HCT -- 29.2* 28.7*  PLT -- 186 167  APTT -- -- --  LABPROT 20.7* 18.8* 19.6*  INR 1.85* 1.63* 1.72*  HEPARINUNFRC -- -- --  CREATININE -- -- --  CKTOTAL -- -- --  CKMB -- -- --  TROPONINI -- -- --    Estimated Creatinine Clearance: 74.3 ml/min (by C-G formula based on Cr of 0.71).   Assessment: 66 yo female s/p R TKA on 04/03/12 to continue on Coumadin for VTE prophylaxis. Baseline INR of 1.07. INR today is subtherapeutic but trending up nicely.  CBC is stable and no bleeding noted in charts. Noted plans to d/c home soon.   Goal of Therapy:  INR 2-3   Plan:  1. Coumadin 6 mg po x 1 today  2. F/u inr tomorrow   Thank you,  Brett Fairy, PharmD 04/07/2012 9:40 AM

## 2012-04-10 NOTE — Progress Notes (Signed)
Utilization review completed. Elzy Tomasello, RN, BSN. 

## 2012-04-29 ENCOUNTER — Ambulatory Visit (HOSPITAL_COMMUNITY): Payer: Medicare Other | Admitting: Physical Therapy

## 2012-04-30 ENCOUNTER — Ambulatory Visit (HOSPITAL_COMMUNITY)
Admission: RE | Admit: 2012-04-30 | Discharge: 2012-04-30 | Disposition: A | Discharge status: Home / Self Care | Payer: Medicare Other | Source: Ambulatory Visit | Attending: Orthopedic Surgery | Admitting: Orthopedic Surgery

## 2012-04-30 DIAGNOSIS — R262 Difficulty in walking, not elsewhere classified: Secondary | ICD-10-CM | POA: Insufficient documentation

## 2012-04-30 DIAGNOSIS — M25569 Pain in unspecified knee: Secondary | ICD-10-CM | POA: Insufficient documentation

## 2012-04-30 DIAGNOSIS — R269 Unspecified abnormalities of gait and mobility: Secondary | ICD-10-CM | POA: Insufficient documentation

## 2012-04-30 DIAGNOSIS — IMO0001 Reserved for inherently not codable concepts without codable children: Secondary | ICD-10-CM | POA: Insufficient documentation

## 2012-04-30 DIAGNOSIS — M25669 Stiffness of unspecified knee, not elsewhere classified: Secondary | ICD-10-CM | POA: Insufficient documentation

## 2012-04-30 DIAGNOSIS — Z96659 Presence of unspecified artificial knee joint: Secondary | ICD-10-CM | POA: Insufficient documentation

## 2012-04-30 NOTE — Evaluation (Signed)
Physical Therapy Evaluation  Patient Details  Name: Ashley Clements MRN: [id number] Date of Birth: Dec 07, 1945  Today's Date: 04/30/2012 Time: 1015-1100 PT Time Calculation (min): 45 min Charges: 1 eval, 15' TE Visit#: 1  of 8   Re-eval: 05/30/12 Assessment Diagnosis: R TKR Surgical Date: 04/03/12 Next MD Visit: Dr. Montez Morita - 2 weeks  Authorization: [facility] Medicare  Authorization Time Period:    Authorization Visit#: 1  of 10    Past Medical History:  Past Medical History  Diagnosis Date  . Hypertension   . Diabetes mellitus     borderline  . Arthritis   . Anemia    Past Surgical History:  Past Surgical History  Procedure Date  . Cholecystectomy   . Abdominal hysterectomy   . Eye surgery     Cataract left, Retina  detachment left  . Total knee arthroplasty 04/03/2012    Procedure: TOTAL KNEE ARTHROPLASTY;  Surgeon: Kennieth Rad, MD;  Location: [facility] OR;  Service: Orthopedics;  Laterality: Right;    Subjective Symptoms/Limitations Pertinent History: Pt is referred to PT s/p R TKR on 04/03/12.  She reports that she has had HHPT for 2 weeks and fells she is doing very well.  She reports restlessness during the night and when sleeping.   How long can you sit comfortably?: w/leg extended, no difficulty. How long can you stand comfortably?: 20 minutes  How long can you walk comfortably?: 10 minutes w/SPC and push grocery cart.  Patient Stated Goals: Pt wants to walk independently.  Pain Assessment Currently in Pain?: Yes Pain Score:   7 Pain Location: Knee Pain Orientation: Right Pain Type: Acute pain;Surgical pain Pain Relieving Factors: Pain medication  Effect of Pain on Daily Activities: sitting.   Prior Functioning  Home Living Lives With: Spouse Additional Comments: 5-6 steps at church. Prior Function Driving: Yes Vocation: Retired Comments: She enjoys being in her house and be with her family.  She enjoys going to church, gardening, working at [employer].   Sensation/Coordination/Flexibility/Functional Tests Functional Tests Functional Tests: 5 STS: 15x  Functional Tests: LEFS: 42/80  Assessment RLE AROM (degrees) Right Knee Extension: 0  Right Knee Flexion: 91  RLE PROM (degrees) Right Knee Extension: 0  Right Knee Flexion: 98  RLE Strength Right Hip Flexion: 3+/5 Right Hip Extension: 3/5 Right Hip ABduction: 3/5 Right Hip ADduction: 4/5 Right Knee Flexion: 5/5 Right Knee Extension: 5/5 Palpation Palpation: increased pain and tenderness to R gastroc and quadriceps  Mobility/Balance  Static Standing Balance Single Leg Stance - Right Leg: 2  Single Leg Stance - Left Leg: 4  Tandem Stance - Right Leg: 8  Tandem Stance - Left Leg: 5  Rhomberg - Eyes Opened: 10  Rhomberg - Eyes Closed: 10    Exercise/Treatments Stretches Quad Stretch: 1 rep;30 seconds Supine Quad Sets: 5 reps (5 sec holds) Heel Slides: 5 reps Straight Leg Raises: Right;10 reps Sidelying Hip ABduction: Right;10 reps Hip ADduction: Right;10 reps Prone  Hamstring Curl: 10 reps Hip Extension: Right;10 reps  Physical Therapy Assessment and Plan PT Assessment and Plan Clinical Impression Statement: Pt is a 66 year old female referred to PT secondary to R TKR on 04/03/12.  She is currently ambulating with a SPC and presents with gait abnormalities which would likely cause secondary impairments when returning to workout and community activities.  Pt will benefit from skilled therapeutic intervention in order to improve on the following deficits: Abnormal gait;Decreased activity tolerance;Decreased balance;Decreased range of motion;Decreased strength;Pain;Difficulty walking;Impaired perceived  functional ability Rehab Potential: Good PT Frequency: Min 3X/week (2-3) PT Duration: 4 weeks PT Treatment/Interventions: Gait training;Functional mobility training;Therapeutic activities;Therapeutic exercise;DME instruction;Balance training;Stair  training;Patient/family education;Manual techniques;Modalities PT Plan: Functional squats, heel and toe raises, standing knee flexion, stair training, SLS, bridging, SAQ's, 4 way SLR. may start with bike for ROM.  Progress     Goals PT Short Term Goals Time to Complete Short Term Goals: 2 weeks PT Short Term Goal 1: Pt will report pain less than 4/10 for 50% of her day for improved QOL>  PT Short Term Goal 2: Pt will improve her hip strength by 1 muscle grade in order to ambulate independently for 10 minutes in closed environment.  PT Short Term Goal 3: Pt will improve her R SLS on solid surface x15 sec independently.  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  PT Long Term Goal 2: Pt will improve her functional strength and demonstrate 5 STS w/o UE A in 12 seconds.  Long Term Goal 3: Pt will improve her knee AROM to [facility] in order to ascend and descend 6 stairs w/1 handrail w/reciprocal pattern in order to safely enter church.  Long Term Goal 4: Pt will improve her activity tolerance in order to attend water aerobics when cleared by MD.   PT Long Term Goal 5: Pt will improver her LEFS to 55/80 for improved percieved functional ability.   Problem List Patient Active Problem List  Diagnosis  . Knee pain  . Knee stiffness  . Difficulty in walking    PT Plan of Care PT Home Exercise Plan: see scanned report PT Patient Instructions: discussed importance of HEP, Cx and NS policy.  Consulted and Agree with Plan of Care: Patient  GP Functional Assessment Tool Used: LEFS: 42/80 Functional Limitation: Mobility: Walking and moving around Mobility: Walking and Moving Around Current Status ([id number]): At least 40 percent but less than 60 percent impaired, limited or  restricted Mobility: Walking and Moving Around Goal Status [phone]): At least 1 percent but less than 20 percent impaired, limited or restricted  Shevon Sian 04/30/2012, 12:10 PM  Physician Documentation Your signature is required to indicate approval of the treatment plan as stated above.  Please sign and either send electronically or make a copy of this report for your files and return this physician signed original.   Please mark one 1.__approve of plan  2. ___approve of plan with the following conditions.   ______________________________                                                          _____________________ Physician Signature  Date  

## 2012-05-01 ENCOUNTER — Ambulatory Visit (HOSPITAL_COMMUNITY)
Admission: RE | Admit: 2012-05-01 | Discharge: 2012-05-01 | Disposition: A | Discharge status: Home / Self Care | Payer: Medicare Other | Source: Ambulatory Visit | Attending: Orthopedic Surgery | Admitting: Orthopedic Surgery

## 2012-05-01 NOTE — Progress Notes (Signed)
Physical Therapy Treatment Patient Details  Name: Ashley Clements MRN: [id number] Date of Birth: 23-Feb-1946  Today's Date: 05/01/2012 Time: 1015-1110 PT Time Calculation (min): 55 min  Visit#: 2  of 8   Re-eval: 05/30/12  Charge: therex 45', ice 10'  Authorization: [employer] Medicare  Authorization Time Period:    Authorization Visit#: 2  of 10    Subjective: Symptoms/Limitations Symptoms: Pt stated R knee pain scale 5/10. Pain Assessment Currently in Pain?: Yes Pain Score:   5 Pain Location: Knee Pain Orientation: Right  Objective:   Exercise/Treatments Stretches Active Hamstring Stretch: 3 reps;30 seconds Aerobic Stationary Bike: Rocking x 8 min Standing Heel Raises: 10 reps;Limitations Heel Raises Limitations: toe raises 10 reps Knee Flexion: 10 reps Lateral Step Up: Right;10 reps;Step Height: 4";Hand Hold: 2 Forward Step Up: Right;10 reps;Hand Hold: 1;Step Height: 4" Functional Squat: 10 reps Rocker Board: Other (comment) (begin next session) SLS: R 3", L 5" Supine Quad Sets: 10 reps (5" holds) Short Arc [employer] Sets: Right;10 reps Heel Slides: 10 reps Bridges: 10 reps Straight Leg Raises: Right;10 reps Knee Flexion: PROM;1 set;Limitations Knee Flexion Limitations: gentle PROM x 30" Sidelying Hip ABduction: Right;10 reps Hip ADduction: Right;10 reps Prone  Hamstring Curl: 10 reps Hip Extension: Right;10 reps   Modalities Modalities: Cryotherapy Cryotherapy Number Minutes Cryotherapy: 10 Minutes Cryotherapy Location: Knee Type of Cryotherapy: Ice pack  Physical Therapy Assessment and Plan PT Assessment and Plan Clinical Impression Statement: Began treatment for R LE strengthening and ROM per PT POC.  Pt able to demonstrate appropriate technique with all exercises following cues with noted instability and weakness from hip musculature, especially with stair training this session. PT Plan: Continue with current POC for ROM and strengthening. Begin  rockerboard next session to assist with weight distribution for improved gait mechanics.    Goals    Problem List Patient Active Problem List  Diagnosis  . Knee pain  . Knee stiffness  . Difficulty in walking    PT - End of Session Activity Tolerance: Patient tolerated treatment well General Behavior During Session: [facility] for tasks performed Cognition: [facility] for tasks performed  GP    Juel Burrow 05/01/2012, 11:09 AM

## 2012-05-06 ENCOUNTER — Ambulatory Visit (HOSPITAL_COMMUNITY)
Admission: RE | Admit: 2012-05-06 | Discharge: 2012-05-06 | Disposition: A | Discharge status: Home / Self Care | Payer: Medicare Other | Source: Ambulatory Visit | Attending: Orthopedic Surgery | Admitting: Orthopedic Surgery

## 2012-05-06 NOTE — Progress Notes (Addendum)
Physical Therapy Treatment Patient Details  Name: Ashley Clements MRN: [id number] Date of Birth: 02-28-1946  Today's Date: 05/06/2012 Time: 4098-1191 PT Time Calculation (min): 80 min  Visit#: 3  of 8   Re-eval: 05/30/12 Assessment Diagnosis: R TKR Surgical Date: 04/03/12 Next MD Visit: Dr. Montez Morita - next week Charge: gait 10', therex 52', ice 10'  Authorization: [facility] Medicare  Authorization Time Period:    Authorization Visit#: 3  of 10    Subjective: Symptoms/Limitations Symptoms: Pt stated she was sore following last session, pain scale 5/10 this morning . Pain Assessment Currently in Pain?: Yes Pain Score:   5 Pain Location: Knee Pain Orientation: Right  Objective:   Exercise/Treatments Stretches Active Hamstring Stretch: 3 reps;30 seconds Quad Stretch: 3 reps;30 seconds Aerobic Stationary Bike: Rocking x 8 min Standing Heel Raises: 10 reps;Limitations Heel Raises Limitations: toe raises 10 reps Knee Flexion: 15 reps Lateral Step Up: Right;10 reps;Step Height: 4";Hand Hold: 2 Forward Step Up: Right;10 reps;Hand Hold: 1;Step Height: 4" Functional Squat: 10 reps Rocker Board: 2 minutes;Limitations Rocker Board Limitations: R/L and A/P with 2 fingerA SLS: L 4", R 3" Gait Training: Gait training 2 RT around dept followiong rocker board Seated Long Arc Quad: 15 reps Supine Quad Sets: 15 reps (5" holds) Short Arc [employer] Sets: 15 reps;Limitations Short Arc Quad Sets Limitations: 2# Heel Slides: 10 reps;Limitations Heel Slides Limitations: 10" holds Bridges: 15 reps;Limitations Bridges Limitations: with ball between knees Straight Leg Raises: Right;15 reps Knee Flexion: PROM;3 sets Prone  Hamstring Curl: 15 reps Hip Extension: 15 reps   Modalities Modalities: Cryotherapy Cryotherapy Number Minutes Cryotherapy: 10 Minutes Cryotherapy Location: Knee Type of Cryotherapy: Ice pack  Physical Therapy Assessment and Plan PT Assessment and Plan Clinical  Impression Statement: Added rockerboard to POC to improve weight distribution with improved gait mechanics noted following new activity.  Pt continues to require multimodal cueing with standing activities and prone hip extensin for proper technique to utilize proper musculature.  Pt tolerated well towards PROM, able to increase to 110 degrees PROM flexion. PT Plan: Continue with current POC for ROM and strengthening. Next session focus on ROM and gait mechanics as well as activites to improve hip musculature strength.    Goals    Problem List Patient Active Problem List  Diagnosis  . Knee pain  . Knee stiffness  . Difficulty in walking    PT - End of Session Activity Tolerance: Patient tolerated treatment well General Behavior During Session: [facility] for tasks performed Cognition: [facility] for tasks performed  GP    Juel Burrow 05/06/2012, 11:01 AM

## 2012-05-08 ENCOUNTER — Ambulatory Visit (HOSPITAL_COMMUNITY)
Admission: RE | Admit: 2012-05-08 | Discharge: 2012-05-08 | Disposition: A | Discharge status: Home / Self Care | Payer: Medicare Other | Source: Ambulatory Visit | Attending: Orthopedic Surgery | Admitting: Orthopedic Surgery

## 2012-05-08 NOTE — Evaluation (Signed)
Physical Therapy Progress Note/G-code Update  Patient Details  Name: Ashley Clements MRN: [id number] Date of Birth: [birth year]  Today's Date: 05/08/2012 Time: 1016-1100 PT Time Calculation (min): 44 min Charges: 23' TE, 10' Manual, 1 ROM, 1 MMT Visit#: 4  of 8   Re-eval: 05/30/12 Assessment Diagnosis: R TKR Surgical Date: 04/03/12 Next MD Visit: Dr. Montez Morita - 05/12/12  Authorization: [facility] Medicare  Authorization Time Period:    Authorization Visit#: 4  of 10    Past Medical History:  Past Medical History  Diagnosis Date  . Hypertension   . Diabetes mellitus     borderline  . Arthritis   . Anemia    Past Surgical History:  Past Surgical History  Procedure Date  . Cholecystectomy   . Abdominal hysterectomy   . Eye surgery     Cataract left, Retina  detachment left  . Total knee arthroplasty 04/03/2012    Procedure: TOTAL KNEE ARTHROPLASTY;  Surgeon: Kennieth Rad, MD;  Location: [facility] OR;  Service: Orthopedics;  Laterality: Right;    Subjective Symptoms/Limitations Symptoms: Pt reports that she feels like it is getting a little better.  She was able to go to the pool yesterday and loosend her knee out.   Sensation/Coordination/Flexibility/Functional Tests Functional Tests Functional Tests: 5 STS: 12 sec (was 15 sec) Functional Tests: LEFS: 31/80 (was 42/80, pt reports she was confused first time)  Assessment RLE AROM (degrees) Right Knee Extension: 0  Right Knee Flexion: 98  RLE PROM (degrees) Right Knee Extension: 0  Right Knee Flexion: 112   RLE Strength Right Hip Flexion:  (4-/5, was 3+/5) Right Hip Extension: 3/5 (was 3/5) Right Hip ABduction: 3+/5 (was 3+/5) Right Hip ADduction:  (4+/5, was 4/5) Right Knee Flexion: 5/5 (was 5/5) Right Knee Extension: 5/5 (was 5/5)  Palpation: decreased pain to gastroc and quardiceps region  Mobility/Balance  Ambulation/Gait Ambulation/Gait: Yes Ambulation/Gait Assistance: 7: Independent Gait Pattern: Decreased  hip/knee flexion - right;Antalgic;Lateral hip instability Static Standing Balance Single Leg Stance - Right Leg: 3  (was 2) Single Leg Stance - Left Leg: 4  (was 4) Tandem Stance - Right Leg: 30  Tandem Stance - Left Leg: 30  (was 5)   Exercise/Treatments Standing Lateral Step Up: Right;15 reps;Hand Hold: 0;Step Height: 4" Forward Step Up: Right;15 reps;Hand Hold: 1;Step Height: 4" Rocker Board: 3 minutes Rocker Board Limitations: R/L independent SLS: L: 4", R: 3" Other Standing Knee Exercises: 5x STS; 12 sec Sidelying Hip ABduction: Right;15 reps Hip ADduction: Right;15 reps Prone  Hamstring Curl: 15 reps Hip Extension: Right;15 reps  Modalities Modalities: Cryotherapy Manual Therapy Manual Therapy: Joint mobilization Joint Mobilization: Grade II-IV to R knee joint to improve knee flexion w/PROM after. x10 minutes for warm up Cryotherapy Cryotherapy Location: Knee  Physical Therapy Assessment and Plan PT Assessment and Plan Clinical Impression Statement: Progress note to MD sent and g-code updaged. Pt has attended 4 OP PT visits over 2 weeks s/p TKR w/following findings: she is progressing well towards her STG and has not met LTG.  Continues to be limited by her overall AROM, functional strength, balance and gait mechancis.  Overall has improvement to distal quadricep activation and is limited due to L knee pain.Pt will benefit from skilled therapeutic intervention in order to improve on the following deficits: Abnormal gait;Decreased range of motion;Decreased strength;Difficulty walking PT Plan: Continue with current POC for ROM and strengthening. Next session focus on ROM and gait mechanics as well as activites to improve hip musculature strength.  Goals Pt will Perform Home Exercise Program: Independently: Met PT Short Term Goals: 2 weeks Term Goal 1: Pt will report pain less than 4/10 for 50% of her day for improved QOL> : Met (Pain to L knee: 5/10) Goal 2: Pt will  improve her hip strength by 1 muscle grade in order to ambulate independently for 10 minutes in closed environment.: Partly met (ambulating all day in her home independently) Goal 3: Pt will improve her R SLS on solid surface x15 sec independently.: Not met (R SLS: 3 sec) Goal 4: Pt will improve R knee AROM 0-100: Progressing toward goal (0-98) PT Long Term Goals: 4 weeks PT Long Term Goal 1: Pt will improve her LE strength in order to 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 : Not met (step to w/1 handrail for ascending and descending) Goal 4: Pt will improve her activity tolerance in order to attend water aerobics when cleared by MD.: Progressing toward goal Goal 5: Pt will improver her LEFS to 55/80 for improved percieved functional ability. Not Met (31/80)  Problem List Patient Active Problem List  Diagnosis  . Knee pain  . Knee stiffness  . Difficulty in walking    PT - End of Session Activity Tolerance: Patient tolerated treatment well General Behavior During Session: [facility] for tasks performed Cognition: [facility] for tasks performed PT Plan of Care PT Patient Instructions: Discussed to focus on balance activities and proper technique to strengthen for best result.  Discussed reasons why pt would continue to benefit from PT services.   GP Functional Assessment Tool Used: LEFS: 31/80 (she reports she was a little confused the first day when she was filling it out.  and states she continues to feel as she is doing better.  Functional Limitation: Mobility: Walking and moving around Mobility: Walking and Moving Around Current Status [phone]): At least 40 percent but less than 60  percent impaired, limited or restricted Mobility: Walking and Moving Around Goal Status [phone]): At least 1 percent but less than 20 percent impaired, limited or restricted  Demerius Podolak, PT 05/08/2012, 11:22 AM  Physician Documentation Your signature is required to indicate approval of the treatment plan as stated above.  Please sign and either send electronically or make a copy of this report for your files and return this physician signed original.   Please mark one 1.__approve of plan  2. ___approve of plan with the following conditions.   ______________________________                                                          _____________________ Physician Signature  Date  

## 2012-05-09 ENCOUNTER — Ambulatory Visit (HOSPITAL_COMMUNITY)
Admission: RE | Admit: 2012-05-09 | Discharge: 2012-05-09 | Disposition: A | Discharge status: Home / Self Care | Payer: Medicare Other | Source: Ambulatory Visit | Attending: Orthopedic Surgery | Admitting: Orthopedic Surgery

## 2012-05-09 NOTE — Progress Notes (Signed)
Physical Therapy Treatment Patient Details  Name: Ashley Clements MRN: [id number] Date of Birth: 14-Feb-1946  Today's Date: 05/09/2012 Time: 1006-1051 PT Time Calculation (min): 45 min Charges: 10' NMR, 25' TE, 10' Manual Visit#: 5  of 12   Re-eval: 05/30/12   Authorization: [facility] Medicare  Authorization Time Period: G-code update at visit 4 on 05/08/12.  Authorization Visit#: 5  of 14    Subjective: Symptoms/Limitations Symptoms: She reports that she went over to see her friends at the [facility] yesterday and went into the pool to do more exercising.  Pain Assessment Currently in Pain?: No/denies  Exercise/Treatments Aerobic Tread Mill: Gait trainer 0.40 cyc/sec. x8 min w/max manual faciliation and demonstration for appropriate gait pattern.  Machines for Strengthening Cybex Knee Extension: 2 PL x10 BLE Standing Lateral Step Up: Right;15 reps;Hand Hold: 0;Step Height: 4";Limitations Lateral Step Up Limitations: Hip Hikes x10 w/max manual faciliation Forward Step Up: Right;15 reps;Hand Hold: 1;Step Height: 4" Functional Squat: 15 reps;Limitations Functional Squat Limitations: lifting volleyball from 8' surface Standing, One Foot on a Step: Eyes open;4 inch;3 reps;30 secs Step Over Hurdles / Cones: 6 in 4 RT w/RLE, 4 RT w/LLE lead Numbers 1-15: Balance Beam;2 reps (B UE 1 rep each)  Manual Therapy Manual Therapy: Joint mobilization Joint Mobilization: Grade II-IV to R knee joint to improve knee flexion w/PROM after  Physical Therapy Assessment and Plan PT Assessment and Plan Clinical Impression Statement: Pt will attend MD apt on [place].  She continues to demonstrate significant weakness to her R LE with notable shaking and fatigue with light weights.  Added hurdles and functional squats with ball raise in order to promote functional knee fleixon with gait.   Pt will benefit from skilled therapeutic intervention in order to improve on the following deficits: Abnormal gait;Decreased  range of motion;Decreased strength;Difficulty walking PT Frequency: Min 2X/week PT Duration: 4 weeks (to reach patient goals. ) PT Plan: F/u on MD apt.  Continue to focus on gait mechanics, functional knee flexion and balance activities (tandem gait, retro gait, heel walking, toe walking, rocker board)    Goals    Problem List Patient Active Problem List  Diagnosis  . Knee pain  . Knee stiffness  . Difficulty in walking    PT - End of Session Activity Tolerance: Patient tolerated treatment well General Behavior During Session: [facility] for tasks performed Cognition: [facility] for tasks performed PT Plan of Care PT Patient Instructions: Discussed to focus on balance activities and proper technique to strengthen for best result.  Discussed reasons why pt would continue to benefit from PT services.   GP Functional Assessment Tool Used: LEFS: 31/80 (she reports she was a little confused the first day when she was filling it out.  and states she continues to feel as she is doing better.  Functional Limitation: Mobility: Walking and moving around Mobility: Walking and Moving Around Current Status [phone]): At least 40 percent but less than 60 percent impaired, limited or restricted Mobility: Walking and Moving Around Goal Status [phone]): At least 1 percent but less than 20 percent impaired, limited or restricted  Laiya Wisby, PT 05/09/2012, 10:56 AM

## 2013-05-13 ENCOUNTER — Other Ambulatory Visit (HOSPITAL_COMMUNITY): Payer: Self-pay | Admitting: Family Medicine

## 2013-05-13 DIAGNOSIS — Z139 Encounter for screening, unspecified: Secondary | ICD-10-CM

## 2013-05-18 ENCOUNTER — Ambulatory Visit (HOSPITAL_COMMUNITY): Payer: Medicare Other

## 2013-05-28 ENCOUNTER — Ambulatory Visit (HOSPITAL_COMMUNITY)
Admission: RE | Admit: 2013-05-28 | Discharge: 2013-05-28 | Disposition: A | Discharge status: Home / Self Care | Payer: Medicare Other | Source: Ambulatory Visit | Attending: Family Medicine | Admitting: Family Medicine

## 2013-05-28 DIAGNOSIS — Z1231 Encounter for screening mammogram for malignant neoplasm of breast: Secondary | ICD-10-CM | POA: Insufficient documentation

## 2013-05-28 DIAGNOSIS — Z139 Encounter for screening, unspecified: Secondary | ICD-10-CM

## 2013-10-19 IMAGING — CR DG CHEST 2V
2 series · 2 of 2 positions shown · non-contrast
Comparison: 03/17/2008

CLINICAL DATA: Preop

CHEST - 2 VIEW

[view not recorded (1 of 2)]
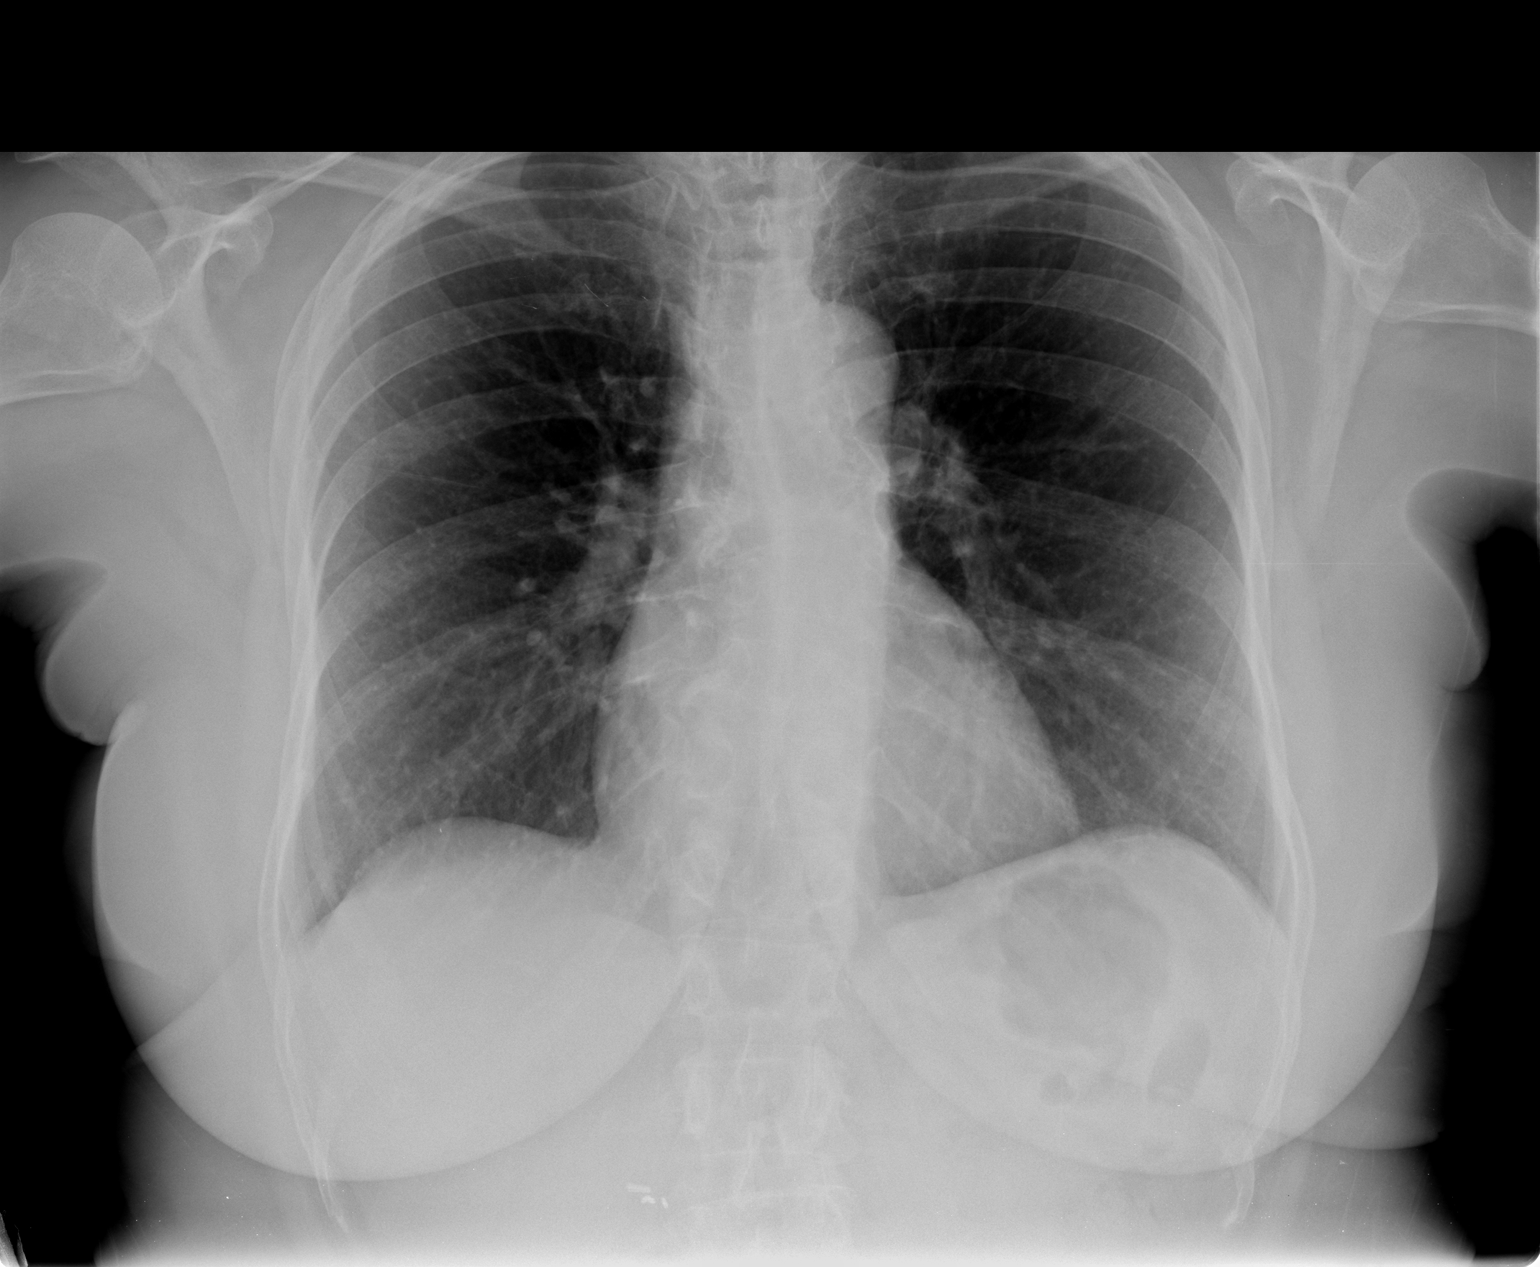

[view not recorded (2 of 2)]
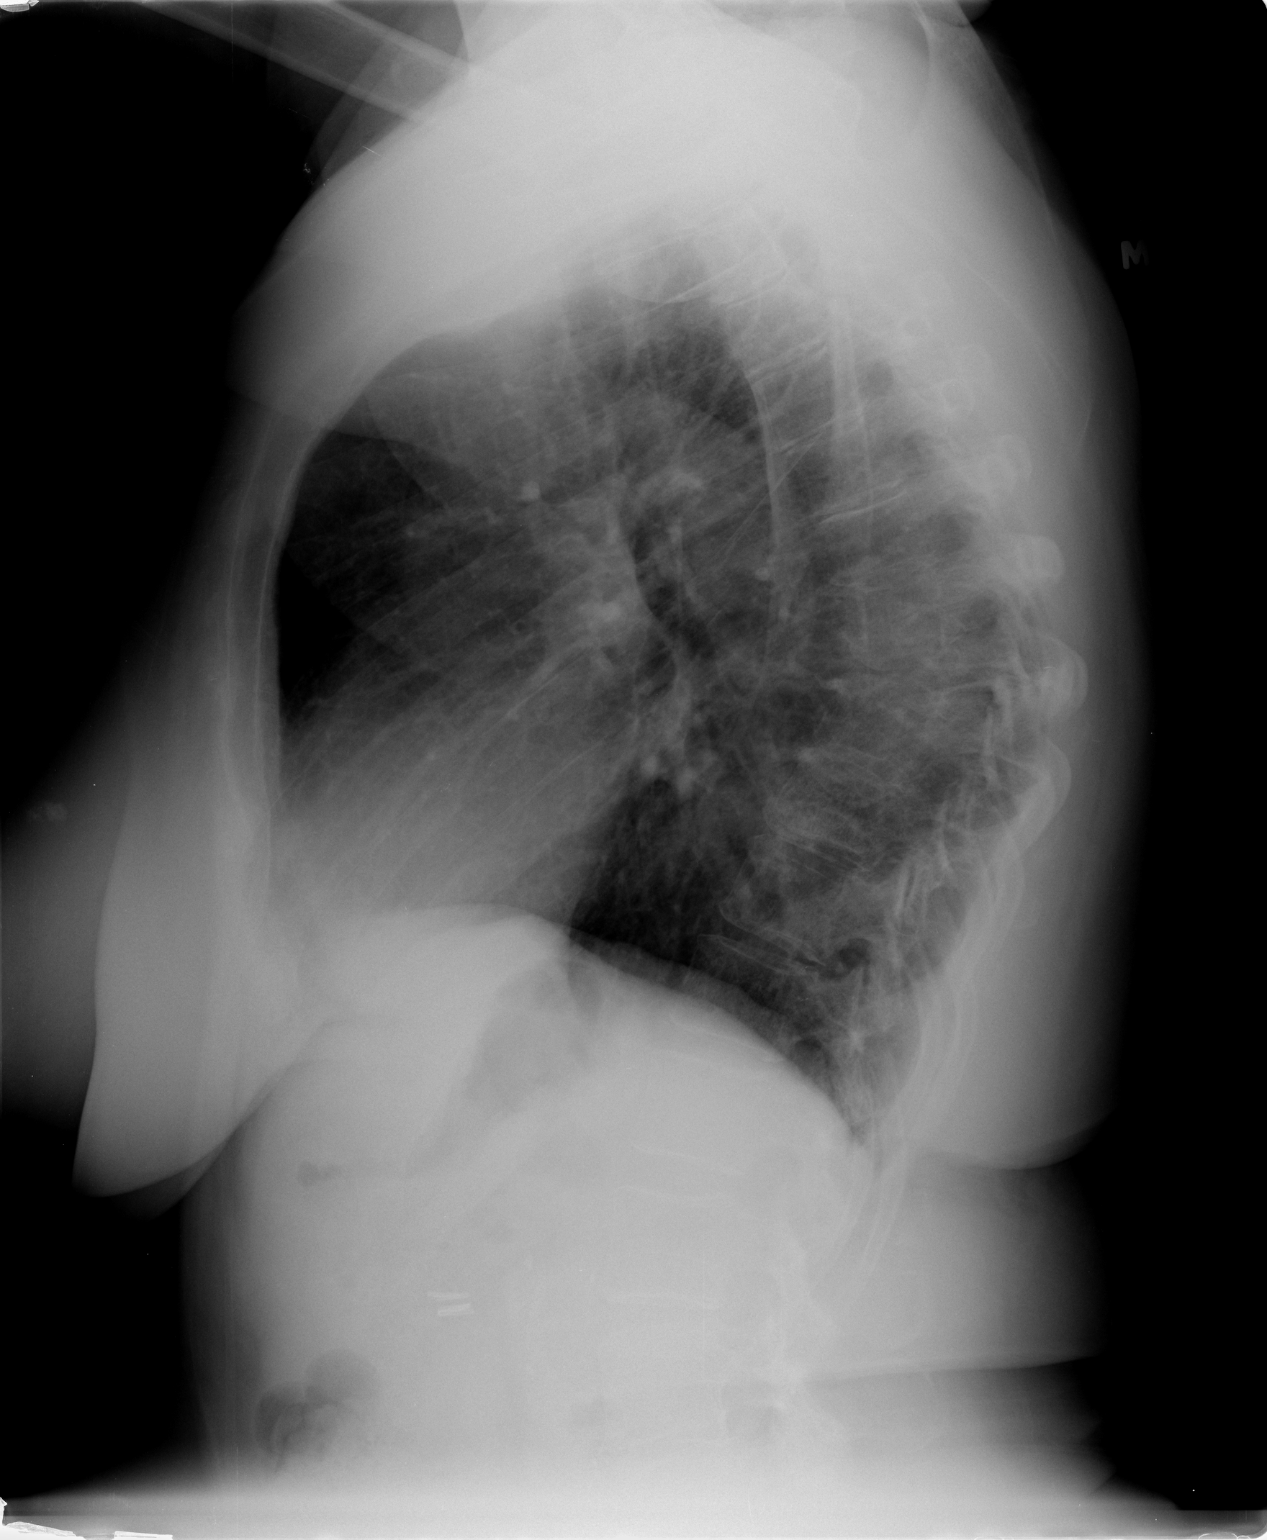

[2 of 2 positions shown; findings below may reference images not displayed]

FINDINGS: Cardiomediastinal silhouette is stable.  No acute
infiltrate or pulmonary edema.  Mild thoracic levoscoliosis.
Stable compression deformity of the T8 vertebral body.
IMPRESSION: No active disease.  Mild thoracic levoscoliosis.  Stable
compression deformity of T8 vertebral body.

## 2013-12-14 ENCOUNTER — Ambulatory Visit
Admission: RE | Admit: 2013-12-14 | Discharge: 2013-12-14 | Disposition: A | Discharge status: Home / Self Care | Payer: Medicare Other | Source: Ambulatory Visit | Attending: Orthopedic Surgery | Admitting: Orthopedic Surgery

## 2013-12-14 ENCOUNTER — Other Ambulatory Visit: Payer: Self-pay | Admitting: Orthopedic Surgery

## 2013-12-14 DIAGNOSIS — M1712 Unilateral primary osteoarthritis, left knee: Secondary | ICD-10-CM

## 2014-02-25 ENCOUNTER — Other Ambulatory Visit (HOSPITAL_COMMUNITY): Payer: Self-pay | Admitting: Family Medicine

## 2014-02-25 DIAGNOSIS — Z780 Asymptomatic menopausal state: Secondary | ICD-10-CM

## 2014-02-25 DIAGNOSIS — Z1231 Encounter for screening mammogram for malignant neoplasm of breast: Secondary | ICD-10-CM

## 2014-03-10 ENCOUNTER — Ambulatory Visit (HOSPITAL_COMMUNITY)
Admission: RE | Admit: 2014-03-10 | Discharge: 2014-03-10 | Disposition: A | Discharge status: Home / Self Care | Payer: Medicare Other | Source: Ambulatory Visit | Attending: Family Medicine | Admitting: Family Medicine

## 2014-03-10 DIAGNOSIS — Z780 Asymptomatic menopausal state: Secondary | ICD-10-CM | POA: Insufficient documentation

## 2014-05-31 ENCOUNTER — Other Ambulatory Visit (HOSPITAL_COMMUNITY): Payer: Medicare Other

## 2014-05-31 ENCOUNTER — Ambulatory Visit (HOSPITAL_COMMUNITY)
Admission: RE | Admit: 2014-05-31 | Discharge: 2014-05-31 | Disposition: A | Discharge status: Home / Self Care | Payer: Medicare Other | Source: Ambulatory Visit | Attending: Family Medicine | Admitting: Family Medicine

## 2014-05-31 DIAGNOSIS — Z1231 Encounter for screening mammogram for malignant neoplasm of breast: Secondary | ICD-10-CM | POA: Diagnosis present

## 2015-03-30 ENCOUNTER — Other Ambulatory Visit (HOSPITAL_COMMUNITY): Payer: Self-pay | Admitting: Family Medicine

## 2015-03-30 DIAGNOSIS — Z1231 Encounter for screening mammogram for malignant neoplasm of breast: Secondary | ICD-10-CM

## 2015-06-06 ENCOUNTER — Ambulatory Visit (HOSPITAL_COMMUNITY)
Admission: RE | Admit: 2015-06-06 | Discharge: 2015-06-06 | Disposition: A | Discharge status: Home / Self Care | Payer: PPO | Source: Ambulatory Visit | Attending: Family Medicine | Admitting: Family Medicine

## 2015-06-06 DIAGNOSIS — Z1231 Encounter for screening mammogram for malignant neoplasm of breast: Secondary | ICD-10-CM | POA: Diagnosis present

## 2015-07-08 DIAGNOSIS — J399 Disease of upper respiratory tract, unspecified: Secondary | ICD-10-CM | POA: Diagnosis not present

## 2015-07-08 DIAGNOSIS — R070 Pain in throat: Secondary | ICD-10-CM | POA: Diagnosis not present

## 2015-07-21 DIAGNOSIS — R7309 Other abnormal glucose: Secondary | ICD-10-CM | POA: Diagnosis not present

## 2015-07-21 DIAGNOSIS — E782 Mixed hyperlipidemia: Secondary | ICD-10-CM | POA: Diagnosis not present

## 2015-07-21 DIAGNOSIS — I10 Essential (primary) hypertension: Secondary | ICD-10-CM | POA: Diagnosis not present

## 2015-08-02 DIAGNOSIS — E119 Type 2 diabetes mellitus without complications: Secondary | ICD-10-CM | POA: Diagnosis not present

## 2015-08-02 DIAGNOSIS — I10 Essential (primary) hypertension: Secondary | ICD-10-CM | POA: Diagnosis not present

## 2015-08-02 DIAGNOSIS — J399 Disease of upper respiratory tract, unspecified: Secondary | ICD-10-CM | POA: Diagnosis not present

## 2015-11-23 DIAGNOSIS — E119 Type 2 diabetes mellitus without complications: Secondary | ICD-10-CM | POA: Diagnosis not present

## 2015-11-23 DIAGNOSIS — E782 Mixed hyperlipidemia: Secondary | ICD-10-CM | POA: Diagnosis not present

## 2015-11-23 DIAGNOSIS — I10 Essential (primary) hypertension: Secondary | ICD-10-CM | POA: Diagnosis not present

## 2015-11-30 DIAGNOSIS — E119 Type 2 diabetes mellitus without complications: Secondary | ICD-10-CM | POA: Diagnosis not present

## 2015-11-30 DIAGNOSIS — I10 Essential (primary) hypertension: Secondary | ICD-10-CM | POA: Diagnosis not present

## 2015-11-30 DIAGNOSIS — F064 Anxiety disorder due to known physiological condition: Secondary | ICD-10-CM | POA: Diagnosis not present

## 2015-11-30 DIAGNOSIS — E785 Hyperlipidemia, unspecified: Secondary | ICD-10-CM | POA: Diagnosis not present

## 2016-03-27 ENCOUNTER — Other Ambulatory Visit (HOSPITAL_COMMUNITY): Payer: Self-pay | Admitting: Family Medicine

## 2016-03-27 DIAGNOSIS — Z1231 Encounter for screening mammogram for malignant neoplasm of breast: Secondary | ICD-10-CM

## 2016-04-27 DIAGNOSIS — I10 Essential (primary) hypertension: Secondary | ICD-10-CM | POA: Diagnosis not present

## 2016-04-27 DIAGNOSIS — E782 Mixed hyperlipidemia: Secondary | ICD-10-CM | POA: Diagnosis not present

## 2016-04-27 DIAGNOSIS — E119 Type 2 diabetes mellitus without complications: Secondary | ICD-10-CM | POA: Diagnosis not present

## 2016-04-27 DIAGNOSIS — F064 Anxiety disorder due to known physiological condition: Secondary | ICD-10-CM | POA: Diagnosis not present

## 2016-05-01 ENCOUNTER — Other Ambulatory Visit (HOSPITAL_COMMUNITY): Payer: Self-pay | Admitting: Family Medicine

## 2016-05-01 ENCOUNTER — Ambulatory Visit (HOSPITAL_COMMUNITY)
Admission: RE | Admit: 2016-05-01 | Discharge: 2016-05-01 | Disposition: A | Discharge status: Home / Self Care | Payer: PPO | Source: Ambulatory Visit | Attending: Family Medicine | Admitting: Family Medicine

## 2016-05-01 DIAGNOSIS — M19041 Primary osteoarthritis, right hand: Secondary | ICD-10-CM | POA: Insufficient documentation

## 2016-05-01 DIAGNOSIS — M1250 Traumatic arthropathy, unspecified site: Secondary | ICD-10-CM | POA: Diagnosis not present

## 2016-05-01 DIAGNOSIS — R7309 Other abnormal glucose: Secondary | ICD-10-CM | POA: Diagnosis not present

## 2016-05-01 DIAGNOSIS — M130 Polyarthritis, unspecified: Secondary | ICD-10-CM

## 2016-05-01 DIAGNOSIS — E785 Hyperlipidemia, unspecified: Secondary | ICD-10-CM | POA: Diagnosis not present

## 2016-05-01 DIAGNOSIS — M19042 Primary osteoarthritis, left hand: Secondary | ICD-10-CM | POA: Diagnosis not present

## 2016-05-01 DIAGNOSIS — I10 Essential (primary) hypertension: Secondary | ICD-10-CM | POA: Diagnosis not present

## 2016-05-29 DIAGNOSIS — Z961 Presence of intraocular lens: Secondary | ICD-10-CM | POA: Diagnosis not present

## 2016-05-29 DIAGNOSIS — H2511 Age-related nuclear cataract, right eye: Secondary | ICD-10-CM | POA: Diagnosis not present

## 2016-06-07 ENCOUNTER — Ambulatory Visit (HOSPITAL_COMMUNITY)
Admission: RE | Admit: 2016-06-07 | Discharge: 2016-06-07 | Disposition: A | Discharge status: Home / Self Care | Payer: PPO | Source: Ambulatory Visit | Attending: Family Medicine | Admitting: Family Medicine

## 2016-06-07 DIAGNOSIS — Z1231 Encounter for screening mammogram for malignant neoplasm of breast: Secondary | ICD-10-CM | POA: Insufficient documentation

## 2016-08-24 DIAGNOSIS — R739 Hyperglycemia, unspecified: Secondary | ICD-10-CM | POA: Diagnosis not present

## 2016-08-24 DIAGNOSIS — I10 Essential (primary) hypertension: Secondary | ICD-10-CM | POA: Diagnosis not present

## 2016-08-24 DIAGNOSIS — E785 Hyperlipidemia, unspecified: Secondary | ICD-10-CM | POA: Diagnosis not present

## 2016-08-29 DIAGNOSIS — M1250 Traumatic arthropathy, unspecified site: Secondary | ICD-10-CM | POA: Diagnosis not present

## 2016-08-29 DIAGNOSIS — E782 Mixed hyperlipidemia: Secondary | ICD-10-CM | POA: Diagnosis not present

## 2016-08-29 DIAGNOSIS — I10 Essential (primary) hypertension: Secondary | ICD-10-CM | POA: Diagnosis not present

## 2016-08-29 DIAGNOSIS — E119 Type 2 diabetes mellitus without complications: Secondary | ICD-10-CM | POA: Diagnosis not present

## 2016-09-04 DIAGNOSIS — Z1211 Encounter for screening for malignant neoplasm of colon: Secondary | ICD-10-CM | POA: Diagnosis not present

## 2016-09-04 DIAGNOSIS — Z1212 Encounter for screening for malignant neoplasm of rectum: Secondary | ICD-10-CM | POA: Diagnosis not present

## 2016-11-09 ENCOUNTER — Encounter (HOSPITAL_COMMUNITY): Payer: Self-pay | Admitting: Emergency Medicine

## 2016-11-09 ENCOUNTER — Ambulatory Visit (HOSPITAL_COMMUNITY)
Admission: EM | Admit: 2016-11-09 | Discharge: 2016-11-09 | Disposition: A | Discharge status: Home / Self Care | Payer: PPO | Attending: Internal Medicine | Admitting: Internal Medicine

## 2016-11-09 DIAGNOSIS — K2100 Gastro-esophageal reflux disease with esophagitis, without bleeding: Secondary | ICD-10-CM

## 2016-11-09 MED ORDER — RANITIDINE HCL 300 MG PO TABS: 300.0000 mg | ORAL_TABLET | Freq: Every day | ORAL | 2 refills | Status: DC

## 2016-11-09 MED ORDER — OMEPRAZOLE 40 MG PO CPDR: 40.0000 mg | DELAYED_RELEASE_CAPSULE | Freq: Two times a day (BID) | ORAL | 0 refills | Status: DC

## 2016-11-09 NOTE — ED Triage Notes (Signed)
Pt c/o intermittent CP that increases w/food onset 3 weeks associated w/belching and bloating, HA, chills  Denies fevers, n/v/d  Taking OTC GasX w/temp relief.   A&O x4... NAD... Ambulatory

## 2016-11-09 NOTE — Discharge Instructions (Signed)
Your symptoms are consistent with acid reflux. Starting on some high-dose omeprazole, take one tablet twice a day for 2 weeks, I'm also starting her on Zantac, take one tablet every night at bedtime. If your pain persist past 2 weeks, follow up with your primary care provider as needed

## 2016-11-09 NOTE — ED Provider Notes (Signed)
CSN: [id number]     Arrival date & time 11/09/16  1902 History   First MD Initiated Contact with Patient 11/09/16 2012     Chief Complaint  Patient presents with  . Chest Pain   (Consider location/radiation/quality/duration/timing/severity/associated sxs/prior Treatment) 71 year old female with a past history of diabetes, hypertension, and anemia presents to the clinic for evaluation of chest, and abdominal pain ongoing for approximately 3 weeks. She states she's had a "fullness" bloating, belching, and burning sensation these past 3 weeks, it is worse at night when she lays down, after eating, has had a cough at night, along with some wheezing. Pain is relieved by sitting upright, and when she refrains from food. She denies any other symptoms.   The history is provided by the patient.  Abdominal Pain  Associated symptoms: nausea   Associated symptoms: no constipation and no vomiting     Past Medical History:  Diagnosis Date  . Anemia   . Arthritis   . Diabetes mellitus    borderline  . Hypertension    Past Surgical History:  Procedure Laterality Date  . ABDOMINAL HYSTERECTOMY    . CHOLECYSTECTOMY    . EYE SURGERY     Cataract left, Retina  detachment left  . TOTAL KNEE ARTHROPLASTY  04/03/2012   Procedure: TOTAL KNEE ARTHROPLASTY;  Surgeon: Sharmon Revere, MD;  Location: [facility];  Service: Orthopedics;  Laterality: Right;   History reviewed. No pertinent family history. Social History  Substance Use Topics  . Smoking status: Never Smoker  . Smokeless tobacco: Never Used  . Alcohol use No   OB History    No data available     Review of Systems  Constitutional: Negative.   HENT: Negative.   Respiratory: Negative.   Gastrointestinal: Positive for abdominal pain and nausea. Negative for constipation and vomiting.  Musculoskeletal: Negative.   Skin: Negative.   Neurological: Negative.     Allergies  Latex and Peanut-containing drug products  Home Medications    Prior to Admission medications   Medication Sig Start Date End Date Taking? Authorizing Provider  CALCIUM-VITAMIN D PO Take 1 tablet by mouth 2 (two) times a week.   Yes [provider]  Multiple Vitamins-Minerals (MULTIVITAMINS THER. W/MINERALS) TABS Take 1 tablet by mouth 2 (two) times a week.   Yes [provider]  raloxifene (EVISTA) 60 MG tablet Take 60 mg by mouth daily.   Yes [provider]  rosuvastatin (CRESTOR) 20 MG tablet Take 20 mg by mouth daily.   Yes [provider]  valsartan-hydrochlorothiazide (DIOVAN-HCT) 80-12.5 MG per tablet Take 1 tablet by mouth daily.   Yes [provider]  ferrous sulfate 325 (65 FE) MG tablet Take 1 tablet (325 mg total) by mouth 3 (three) times daily after meals. 04/07/12   Marily Memos, MD  niacin 500 MG tablet Take 100 mg by mouth at bedtime.    [provider]  omeprazole (PRILOSEC) 40 MG capsule Take 1 capsule (40 mg total) by mouth 2 (two) times daily. 11/09/16 11/23/16  Barnet Glasgow, NP  ranitidine (ZANTAC) 300 MG tablet Take 1 tablet (300 mg total) by mouth at bedtime. 11/09/16   Barnet Glasgow, NP   Meds Ordered and Administered this Visit  Medications - No data to display  BP 140/68 (BP Location: Right Arm)   Pulse 60   Temp 98.5 F (36.9 C) (Oral)   Resp 14   SpO2 99%  No data found.   Physical Exam  Constitutional:  She is oriented to person, place, and time. She appears well-developed and well-nourished. No distress.  HENT:  Head: Normocephalic and atraumatic.  Right Ear: External ear normal.  Left Ear: External ear normal.  Eyes: Conjunctivae are normal.  Neck: Normal range of motion.  Cardiovascular: Normal rate and regular rhythm.   Pulmonary/Chest: Effort normal and breath sounds normal.  Abdominal: Normal appearance and bowel sounds are normal. There is no hepatosplenomegaly. There is tenderness in the epigastric area. There is no CVA tenderness.   Neurological: She is alert and oriented to person, place, and time.  Skin: Skin is warm and dry. Capillary refill takes less than 2 seconds. No rash noted. She is not diaphoretic. No erythema.  Psychiatric: She has a normal mood and affect. Her behavior is normal.  Nursing note and vitals reviewed.   Urgent Care Course     Procedures (including critical care time)  Labs Review Labs Reviewed - No data to display  Imaging Review No results found.     MDM   1. Gastroesophageal reflux disease with esophagitis    Started on high dose Prilosec for 2 weeks, Zantac every night at bedtime. Provided counseling on food choices for reflux, follow-up with primary care provider in 2 weeks if symptoms persist    Barnet Glasgow, NP 11/09/16 2059

## 2016-11-29 DIAGNOSIS — E782 Mixed hyperlipidemia: Secondary | ICD-10-CM | POA: Diagnosis not present

## 2016-11-29 DIAGNOSIS — E119 Type 2 diabetes mellitus without complications: Secondary | ICD-10-CM | POA: Diagnosis not present

## 2016-11-29 DIAGNOSIS — I10 Essential (primary) hypertension: Secondary | ICD-10-CM | POA: Diagnosis not present

## 2016-11-30 DIAGNOSIS — K219 Gastro-esophageal reflux disease without esophagitis: Secondary | ICD-10-CM | POA: Diagnosis not present

## 2016-11-30 DIAGNOSIS — I10 Essential (primary) hypertension: Secondary | ICD-10-CM | POA: Diagnosis not present

## 2016-11-30 DIAGNOSIS — F064 Anxiety disorder due to known physiological condition: Secondary | ICD-10-CM | POA: Diagnosis not present

## 2016-12-18 DIAGNOSIS — K219 Gastro-esophageal reflux disease without esophagitis: Secondary | ICD-10-CM | POA: Diagnosis not present

## 2016-12-18 DIAGNOSIS — E119 Type 2 diabetes mellitus without complications: Secondary | ICD-10-CM | POA: Diagnosis not present

## 2016-12-18 DIAGNOSIS — E782 Mixed hyperlipidemia: Secondary | ICD-10-CM | POA: Diagnosis not present

## 2016-12-18 DIAGNOSIS — I10 Essential (primary) hypertension: Secondary | ICD-10-CM | POA: Diagnosis not present

## 2016-12-18 DIAGNOSIS — M1250 Traumatic arthropathy, unspecified site: Secondary | ICD-10-CM | POA: Diagnosis not present

## 2017-04-10 DIAGNOSIS — H2000 Unspecified acute and subacute iridocyclitis: Secondary | ICD-10-CM | POA: Diagnosis not present

## 2017-04-16 DIAGNOSIS — H2000 Unspecified acute and subacute iridocyclitis: Secondary | ICD-10-CM | POA: Diagnosis not present

## 2017-04-30 ENCOUNTER — Other Ambulatory Visit (HOSPITAL_COMMUNITY): Payer: Self-pay | Admitting: Family Medicine

## 2017-04-30 DIAGNOSIS — Z1231 Encounter for screening mammogram for malignant neoplasm of breast: Secondary | ICD-10-CM

## 2017-05-28 DIAGNOSIS — H524 Presbyopia: Secondary | ICD-10-CM | POA: Diagnosis not present

## 2017-05-31 DIAGNOSIS — I10 Essential (primary) hypertension: Secondary | ICD-10-CM | POA: Diagnosis not present

## 2017-05-31 DIAGNOSIS — E785 Hyperlipidemia, unspecified: Secondary | ICD-10-CM | POA: Diagnosis not present

## 2017-05-31 DIAGNOSIS — E119 Type 2 diabetes mellitus without complications: Secondary | ICD-10-CM | POA: Diagnosis not present

## 2017-06-06 DIAGNOSIS — R7309 Other abnormal glucose: Secondary | ICD-10-CM | POA: Diagnosis not present

## 2017-06-06 DIAGNOSIS — I10 Essential (primary) hypertension: Secondary | ICD-10-CM | POA: Diagnosis not present

## 2017-06-10 ENCOUNTER — Ambulatory Visit (HOSPITAL_COMMUNITY)
Admission: RE | Admit: 2017-06-10 | Discharge: 2017-06-10 | Disposition: A | Discharge status: Home / Self Care | Payer: PPO | Source: Ambulatory Visit | Attending: Family Medicine | Admitting: Family Medicine

## 2017-06-10 ENCOUNTER — Encounter (HOSPITAL_COMMUNITY): Payer: Self-pay

## 2017-06-10 DIAGNOSIS — Z1231 Encounter for screening mammogram for malignant neoplasm of breast: Secondary | ICD-10-CM

## 2017-10-03 DIAGNOSIS — E782 Mixed hyperlipidemia: Secondary | ICD-10-CM | POA: Diagnosis not present

## 2017-10-03 DIAGNOSIS — I10 Essential (primary) hypertension: Secondary | ICD-10-CM | POA: Diagnosis not present

## 2017-10-03 DIAGNOSIS — R7309 Other abnormal glucose: Secondary | ICD-10-CM | POA: Diagnosis not present

## 2017-10-08 DIAGNOSIS — I10 Essential (primary) hypertension: Secondary | ICD-10-CM | POA: Diagnosis not present

## 2017-10-08 DIAGNOSIS — E119 Type 2 diabetes mellitus without complications: Secondary | ICD-10-CM | POA: Diagnosis not present

## 2018-01-31 DIAGNOSIS — E782 Mixed hyperlipidemia: Secondary | ICD-10-CM | POA: Diagnosis not present

## 2018-01-31 DIAGNOSIS — I10 Essential (primary) hypertension: Secondary | ICD-10-CM | POA: Diagnosis not present

## 2018-01-31 DIAGNOSIS — R7309 Other abnormal glucose: Secondary | ICD-10-CM | POA: Diagnosis not present

## 2018-02-05 DIAGNOSIS — Z6826 Body mass index (BMI) 26.0-26.9, adult: Secondary | ICD-10-CM | POA: Diagnosis not present

## 2018-02-05 DIAGNOSIS — E785 Hyperlipidemia, unspecified: Secondary | ICD-10-CM | POA: Diagnosis not present

## 2018-02-05 DIAGNOSIS — I10 Essential (primary) hypertension: Secondary | ICD-10-CM | POA: Diagnosis not present

## 2018-02-05 DIAGNOSIS — E1100 Type 2 diabetes mellitus with hyperosmolarity without nonketotic hyperglycemic-hyperosmolar coma (NKHHC): Secondary | ICD-10-CM | POA: Diagnosis not present

## 2018-04-24 ENCOUNTER — Observation Stay (HOSPITAL_COMMUNITY): Payer: PPO

## 2018-04-24 ENCOUNTER — Encounter (HOSPITAL_COMMUNITY): Payer: Self-pay

## 2018-04-24 ENCOUNTER — Emergency Department (HOSPITAL_COMMUNITY): Payer: PPO

## 2018-04-24 ENCOUNTER — Other Ambulatory Visit: Payer: Self-pay

## 2018-04-24 ENCOUNTER — Observation Stay (HOSPITAL_COMMUNITY)
Admission: EM | Admit: 2018-04-24 | Discharge: 2018-04-25 | Disposition: A | Discharge status: Home / Self Care | Payer: PPO | Attending: Internal Medicine | Admitting: Internal Medicine

## 2018-04-24 ENCOUNTER — Observation Stay (HOSPITAL_COMMUNITY)
Admit: 2018-04-24 | Discharge: 2018-04-24 | Disposition: A | Discharge status: Home / Self Care | Payer: PPO | Attending: Internal Medicine | Admitting: Internal Medicine

## 2018-04-24 DIAGNOSIS — R55 Syncope and collapse: Secondary | ICD-10-CM | POA: Diagnosis not present

## 2018-04-24 DIAGNOSIS — D509 Iron deficiency anemia, unspecified: Secondary | ICD-10-CM | POA: Insufficient documentation

## 2018-04-24 DIAGNOSIS — Z91010 Allergy to peanuts: Secondary | ICD-10-CM | POA: Diagnosis not present

## 2018-04-24 DIAGNOSIS — Z91040 Latex allergy status: Secondary | ICD-10-CM | POA: Diagnosis not present

## 2018-04-24 DIAGNOSIS — E785 Hyperlipidemia, unspecified: Secondary | ICD-10-CM | POA: Insufficient documentation

## 2018-04-24 DIAGNOSIS — M25511 Pain in right shoulder: Secondary | ICD-10-CM | POA: Insufficient documentation

## 2018-04-24 DIAGNOSIS — Z79899 Other long term (current) drug therapy: Secondary | ICD-10-CM | POA: Insufficient documentation

## 2018-04-24 DIAGNOSIS — I959 Hypotension, unspecified: Secondary | ICD-10-CM | POA: Diagnosis not present

## 2018-04-24 DIAGNOSIS — M1710 Unilateral primary osteoarthritis, unspecified knee: Secondary | ICD-10-CM | POA: Diagnosis present

## 2018-04-24 DIAGNOSIS — S299XXA Unspecified injury of thorax, initial encounter: Secondary | ICD-10-CM | POA: Diagnosis not present

## 2018-04-24 DIAGNOSIS — Z8679 Personal history of other diseases of the circulatory system: Secondary | ICD-10-CM | POA: Diagnosis not present

## 2018-04-24 DIAGNOSIS — S4991XA Unspecified injury of right shoulder and upper arm, initial encounter: Secondary | ICD-10-CM | POA: Diagnosis not present

## 2018-04-24 DIAGNOSIS — R0789 Other chest pain: Secondary | ICD-10-CM | POA: Diagnosis not present

## 2018-04-24 DIAGNOSIS — R52 Pain, unspecified: Secondary | ICD-10-CM

## 2018-04-24 DIAGNOSIS — M179 Osteoarthritis of knee, unspecified: Secondary | ICD-10-CM | POA: Diagnosis not present

## 2018-04-24 DIAGNOSIS — R079 Chest pain, unspecified: Secondary | ICD-10-CM | POA: Diagnosis not present

## 2018-04-24 DIAGNOSIS — Z96651 Presence of right artificial knee joint: Secondary | ICD-10-CM | POA: Insufficient documentation

## 2018-04-24 DIAGNOSIS — R0689 Other abnormalities of breathing: Secondary | ICD-10-CM | POA: Diagnosis not present

## 2018-04-24 DIAGNOSIS — E119 Type 2 diabetes mellitus without complications: Secondary | ICD-10-CM | POA: Insufficient documentation

## 2018-04-24 DIAGNOSIS — I10 Essential (primary) hypertension: Secondary | ICD-10-CM

## 2018-04-24 DIAGNOSIS — R42 Dizziness and giddiness: Secondary | ICD-10-CM | POA: Diagnosis not present

## 2018-04-24 DIAGNOSIS — S0990XA Unspecified injury of head, initial encounter: Secondary | ICD-10-CM | POA: Diagnosis not present

## 2018-04-24 LAB — URINALYSIS, ROUTINE W REFLEX MICROSCOPIC
BILIRUBIN URINE: NEGATIVE
Glucose, UA: NEGATIVE mg/dL
HGB URINE DIPSTICK: NEGATIVE
KETONES UR: NEGATIVE mg/dL
Leukocytes, UA: NEGATIVE
Nitrite: NEGATIVE
PROTEIN: NEGATIVE mg/dL
Specific Gravity, Urine: 1.015 (ref 1.005–1.030)
pH: 7 (ref 5.0–8.0)

## 2018-04-24 LAB — CBC WITH DIFFERENTIAL/PLATELET
ABS IMMATURE GRANULOCYTES: 0.03 10*3/uL (ref 0.00–0.07)
BASOS PCT: 1 %
Basophils Absolute: 0 10*3/uL (ref 0.0–0.1)
EOS PCT: 5 %
Eosinophils Absolute: 0.2 10*3/uL (ref 0.0–0.5)
HCT: 34.8 % — ABNORMAL LOW (ref 36.0–46.0)
HEMOGLOBIN: 10.7 g/dL — AB (ref 12.0–15.0)
IMMATURE GRANULOCYTES: 1 %
Lymphocytes Relative: 33 %
Lymphs Abs: 1.8 10*3/uL (ref 0.7–4.0)
MCH: 27 pg (ref 26.0–34.0)
MCHC: 30.7 g/dL (ref 30.0–36.0)
MCV: 87.9 fL (ref 80.0–100.0)
MONO ABS: 0.3 10*3/uL (ref 0.1–1.0)
MONOS PCT: 6 %
Neutro Abs: 3 10*3/uL (ref 1.7–7.7)
Neutrophils Relative %: 54 %
Platelets: 218 10*3/uL (ref 150–400)
RBC: 3.96 MIL/uL (ref 3.87–5.11)
RDW: 14.3 % (ref 11.5–15.5)
WBC: 5.3 10*3/uL (ref 4.0–10.5)
nRBC: 0 % (ref 0.0–0.2)

## 2018-04-24 LAB — COMPREHENSIVE METABOLIC PANEL
ALBUMIN: 3.3 g/dL — AB (ref 3.5–5.0)
ALT: 14 U/L (ref 0–44)
AST: 21 U/L (ref 15–41)
Alkaline Phosphatase: 37 U/L — ABNORMAL LOW (ref 38–126)
Anion gap: 6 (ref 5–15)
BUN: 12 mg/dL (ref 8–23)
CHLORIDE: 110 mmol/L (ref 98–111)
CO2: 24 mmol/L (ref 22–32)
CREATININE: 0.61 mg/dL (ref 0.44–1.00)
Calcium: 8.6 mg/dL — ABNORMAL LOW (ref 8.9–10.3)
GFR calc non Af Amer: >60 mL/min (ref >60)
GLUCOSE: 115 mg/dL — AB (ref 70–99)
Potassium: 3.6 mmol/L (ref 3.5–5.1)
SODIUM: 140 mmol/L (ref 135–145)
Total Bilirubin: 0.4 mg/dL (ref 0.3–1.2)
Total Protein: 6.2 g/dL — ABNORMAL LOW (ref 6.5–8.1)

## 2018-04-24 LAB — CK: CK TOTAL: 130 U/L (ref 38–234)

## 2018-04-24 LAB — TROPONIN I
Troponin I: <0.03 ng/mL
Troponin I: <0.03 ng/mL

## 2018-04-24 LAB — MAGNESIUM: Magnesium: 2 mg/dL (ref 1.7–2.4)

## 2018-04-24 LAB — CBG MONITORING, ED: GLUCOSE-CAPILLARY: 74 mg/dL (ref 70–99)

## 2018-04-24 MED ORDER — LORAZEPAM 2 MG/ML IJ SOLN
1.0000 mg | Freq: Four times a day (QID) | INTRAMUSCULAR | Status: DC | PRN
  Administered 2018-04-24: 1 mg via INTRAVENOUS
  Filled 2018-04-24: qty 1

## 2018-04-24 MED ORDER — SODIUM CHLORIDE 0.9 % IV BOLUS
500.0000 mL | Freq: Once | INTRAVENOUS | Status: AC
  Administered 2018-04-24: 500 mL via INTRAVENOUS

## 2018-04-24 MED ORDER — ROSUVASTATIN CALCIUM 20 MG PO TABS
20.0000 mg | ORAL_TABLET | Freq: Every day | ORAL | Status: DC
  Administered 2018-04-24 – 2018-04-25 (×2): 20 mg via ORAL
  Filled 2018-04-24 (×4): qty 1

## 2018-04-24 MED ORDER — CALCIUM CARBONATE-VITAMIN D 500-200 MG-UNIT PO TABS
1.0000 | ORAL_TABLET | Freq: Two times a day (BID) | ORAL | Status: DC
  Administered 2018-04-24 – 2018-04-25 (×2): 1 via ORAL
  Filled 2018-04-24 (×4): qty 1

## 2018-04-24 MED ORDER — SODIUM CHLORIDE 0.9% FLUSH
3.0000 mL | Freq: Two times a day (BID) | INTRAVENOUS | Status: DC
  Administered 2018-04-24 – 2018-04-25 (×2): 3 mL via INTRAVENOUS

## 2018-04-24 MED ORDER — ACETAMINOPHEN 325 MG PO TABS
650.0000 mg | ORAL_TABLET | Freq: Four times a day (QID) | ORAL | Status: DC | PRN
  Administered 2018-04-24: 650 mg via ORAL
  Filled 2018-04-24: qty 2

## 2018-04-24 MED ORDER — ONDANSETRON HCL 4 MG PO TABS: 4.0000 mg | ORAL_TABLET | Freq: Four times a day (QID) | ORAL | Status: DC | PRN

## 2018-04-24 MED ORDER — ADULT MULTIVITAMIN W/MINERALS CH
1.0000 | ORAL_TABLET | ORAL | Status: DC
  Administered 2018-04-24: 1 via ORAL
  Filled 2018-04-24: qty 1

## 2018-04-24 MED ORDER — NIACIN 100 MG PO TABS: 100.0000 mg | ORAL_TABLET | Freq: Every day | ORAL | Status: DC

## 2018-04-24 MED ORDER — RALOXIFENE HCL 60 MG PO TABS
60.0000 mg | ORAL_TABLET | Freq: Every day | ORAL | Status: DC
  Administered 2018-04-25: 60 mg via ORAL
  Filled 2018-04-24: qty 1

## 2018-04-24 MED ORDER — SODIUM CHLORIDE 0.9 % IV SOLN
INTRAVENOUS | Status: DC
  Administered 2018-04-24 – 2018-04-25 (×2): via INTRAVENOUS

## 2018-04-24 MED ORDER — FAMOTIDINE 20 MG PO TABS
20.0000 mg | ORAL_TABLET | Freq: Every day | ORAL | Status: DC
  Administered 2018-04-24: 20 mg via ORAL
  Filled 2018-04-24: qty 1

## 2018-04-24 MED ORDER — FERROUS SULFATE 325 (65 FE) MG PO TABS
325.0000 mg | ORAL_TABLET | Freq: Three times a day (TID) | ORAL | Status: DC
  Administered 2018-04-24 – 2018-04-25 (×2): 325 mg via ORAL
  Filled 2018-04-24 (×6): qty 1

## 2018-04-24 MED ORDER — ENOXAPARIN SODIUM 40 MG/0.4ML ~~LOC~~ SOLN
40.0000 mg | SUBCUTANEOUS | Status: DC
  Administered 2018-04-24: 40 mg via SUBCUTANEOUS
  Filled 2018-04-24 (×2): qty 0.4

## 2018-04-24 MED ORDER — ACETAMINOPHEN 650 MG RE SUPP: 650.0000 mg | Freq: Four times a day (QID) | RECTAL | Status: DC | PRN

## 2018-04-24 MED ORDER — ONDANSETRON HCL 4 MG/2ML IJ SOLN: 4.0000 mg | Freq: Four times a day (QID) | INTRAMUSCULAR | Status: DC | PRN
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
## 2018-04-24 NOTE — ED Notes (Signed)
cbg 104 per ems.

## 2018-04-24 NOTE — Progress Notes (Signed)
EEG completed; results pending.    

## 2018-04-24 NOTE — ED Notes (Signed)
Pt given heart healthy meal tray 
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
## 2018-04-24 NOTE — ED Notes (Signed)
EEG in progress 
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
## 2018-04-25 ENCOUNTER — Observation Stay (HOSPITAL_BASED_OUTPATIENT_CLINIC_OR_DEPARTMENT_OTHER): Payer: PPO

## 2018-04-25 DIAGNOSIS — R0789 Other chest pain: Secondary | ICD-10-CM | POA: Diagnosis not present

## 2018-04-25 DIAGNOSIS — R55 Syncope and collapse: Secondary | ICD-10-CM

## 2018-04-25 DIAGNOSIS — I10 Essential (primary) hypertension: Secondary | ICD-10-CM | POA: Diagnosis not present

## 2018-04-25 LAB — ECHOCARDIOGRAM COMPLETE
Height: 66 in
Weight: 2720 oz

## 2018-04-25 LAB — TROPONIN I

## 2018-04-25 NOTE — Care Management Obs Status (Signed)
[place] NOTIFICATION   Patient Details  Name: Ashley Clements MRN: [id number] Date of Birth: [birth year]   Medicare Observation Status Notification Given:  Yes    Shelda Altes 04/25/2018, 12:13 PM
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
## 2018-04-25 NOTE — Progress Notes (Signed)
2D Echocardiogram has been performed.  Ashley Clements 04/25/2018, 7:59 AM

## 2018-04-25 NOTE — Discharge Instructions (Addendum)
It is recommended that you should not drive any vehicle, operating heavy machinery, involved in water activities including swimming, water sports or use water tubs, or be in extreme heights until you are cleared by her PCP or outpatient neurology.       Syncope Syncope is when you temporarily lose consciousness. Syncope may also be called fainting or passing out. It is caused by a sudden decrease in blood flow to the brain. Even though most causes of syncope are not dangerous, syncope can be a sign of a serious medical problem. Signs that you may be about to faint include:  Feeling dizzy or light-headed.  Feeling nauseous.  Seeing all white or all black in your field of vision.  Having cold, clammy skin.  If you fainted, get medical help right away.Call your local emergency services (911 in the [place] Do not drive yourself to the hospital. Follow these instructions at home: Pay attention to any changes in your symptoms. Take these actions to help with your condition:  Have someone stay with you until you feel stable.  Do not drive, use machinery, or play sports until your health care provider says it is okay.  Keep all follow-up visits as told by your health care provider. This is important.  If you start to feel like you might faint, lie down right away and raise (elevate) your feet above the level of your heart. Breathe deeply and steadily. Wait until all of the symptoms have passed.  Drink enough fluid to keep your urine clear or pale yellow.  If you are taking blood pressure or heart medicine, get up slowly and take several minutes to sit and then stand. This can reduce dizziness.  Take over-the-counter and prescription medicines only as told by your health care provider.  Get help right away if:  You have a severe headache.  You have unusual pain in your chest, abdomen, or back.  You are bleeding from your mouth or rectum, or you have black or tarry stool.  You have a  very fast or irregular heartbeat (palpitations).  You have pain with breathing.  You faint once or repeatedly.  You have a seizure.  You are confused.  You have trouble walking.  You have severe weakness.  You have vision problems. These symptoms may represent a serious problem that is an emergency. Do not wait to see if your symptoms will go away. Get medical help right away. Call your local emergency services (911 in the [place] Do not drive yourself to the hospital. This information is not intended to replace advice given to you by your health care provider. Make sure you discuss any questions you have with your health care provider. Document Released: 06/11/2005 Document Revised: 11/17/2015 Document Reviewed: 02/23/2015 Elsevier Interactive Patient Education  [employer].

## 2018-04-25 NOTE — Procedures (Signed)
History: 72 year old female with a history of syncope  Sedation: None  Technique: This is a 21 channel routine scalp EEG performed at the bedside with bipolar and monopolar montages arranged in accordance to the international 10/20 system of electrode placement. One channel was dedicated to EKG recording.    Background: The background consists of intermixed alpha and beta activities. There is a well defined posterior dominant rhythm of 9 hz that attenuates with eye opening. Sleep is recorded with normal appearing structures.   Photic stimulation: Physiologic driving is not performed  EEG Abnormalities: None  Clinical Interpretation: This normal EEG is recorded in the waking and sleep state. There was no seizure or seizure predisposition recorded on this study. Please note that a normal EEG does not preclude the possibility of epilepsy.   Roland Rack, MD [facility] [phone]  If 7pm- 7am, please page neurology on call as listed in [place].

## 2018-04-28 ENCOUNTER — Telehealth: Payer: Self-pay | Admitting: *Deleted

## 2018-04-28 DIAGNOSIS — I498 Other specified cardiac arrhythmias: Secondary | ICD-10-CM

## 2018-04-28 NOTE — Telephone Encounter (Signed)
-----   Message from [name], [place] sent at 04/25/2018  9:58 AM EDT ----- Regarding: Event Monitor Ashley Clements,   Can you please help arrange for this patient to have a 30-day outpatient cardiac event monitor for syncope at the request of Dr. Clementeen Graham [facility]).   Thanks,  [place]

## 2018-05-02 ENCOUNTER — Ambulatory Visit (INDEPENDENT_AMBULATORY_CARE_PROVIDER_SITE_OTHER): Payer: PPO

## 2018-05-02 DIAGNOSIS — I499 Cardiac arrhythmia, unspecified: Secondary | ICD-10-CM | POA: Diagnosis not present

## 2018-05-02 DIAGNOSIS — I498 Other specified cardiac arrhythmias: Secondary | ICD-10-CM

## 2018-05-06 DIAGNOSIS — M791 Myalgia, unspecified site: Secondary | ICD-10-CM | POA: Diagnosis not present

## 2018-05-19 ENCOUNTER — Other Ambulatory Visit (HOSPITAL_COMMUNITY): Payer: Self-pay | Admitting: Family Medicine

## 2018-05-19 DIAGNOSIS — Z1231 Encounter for screening mammogram for malignant neoplasm of breast: Secondary | ICD-10-CM

## 2018-06-05 DIAGNOSIS — I10 Essential (primary) hypertension: Secondary | ICD-10-CM | POA: Diagnosis not present

## 2018-06-05 DIAGNOSIS — E119 Type 2 diabetes mellitus without complications: Secondary | ICD-10-CM | POA: Diagnosis not present

## 2018-06-05 DIAGNOSIS — E782 Mixed hyperlipidemia: Secondary | ICD-10-CM | POA: Diagnosis not present

## 2018-06-10 DIAGNOSIS — I10 Essential (primary) hypertension: Secondary | ICD-10-CM | POA: Diagnosis not present

## 2018-06-10 DIAGNOSIS — Z6837 Body mass index (BMI) 37.0-37.9, adult: Secondary | ICD-10-CM | POA: Diagnosis not present

## 2018-06-10 DIAGNOSIS — M130 Polyarthritis, unspecified: Secondary | ICD-10-CM | POA: Diagnosis not present

## 2018-06-10 DIAGNOSIS — H5213 Myopia, bilateral: Secondary | ICD-10-CM | POA: Diagnosis not present

## 2018-06-10 DIAGNOSIS — E785 Hyperlipidemia, unspecified: Secondary | ICD-10-CM | POA: Diagnosis not present

## 2018-06-10 DIAGNOSIS — Z961 Presence of intraocular lens: Secondary | ICD-10-CM | POA: Diagnosis not present

## 2018-06-10 DIAGNOSIS — H524 Presbyopia: Secondary | ICD-10-CM | POA: Diagnosis not present

## 2018-06-10 DIAGNOSIS — H2511 Age-related nuclear cataract, right eye: Secondary | ICD-10-CM | POA: Diagnosis not present

## 2018-06-10 DIAGNOSIS — R7309 Other abnormal glucose: Secondary | ICD-10-CM | POA: Diagnosis not present

## 2018-06-12 ENCOUNTER — Ambulatory Visit (HOSPITAL_COMMUNITY)
Admission: RE | Admit: 2018-06-12 | Discharge: 2018-06-12 | Disposition: A | Discharge status: Home / Self Care | Payer: PPO | Source: Ambulatory Visit | Attending: Family Medicine | Admitting: Family Medicine

## 2018-06-12 DIAGNOSIS — Z1231 Encounter for screening mammogram for malignant neoplasm of breast: Secondary | ICD-10-CM

## 2018-07-08 DIAGNOSIS — Z6826 Body mass index (BMI) 26.0-26.9, adult: Secondary | ICD-10-CM | POA: Diagnosis not present

## 2018-07-08 DIAGNOSIS — E785 Hyperlipidemia, unspecified: Secondary | ICD-10-CM | POA: Diagnosis not present

## 2018-07-08 DIAGNOSIS — E119 Type 2 diabetes mellitus without complications: Secondary | ICD-10-CM | POA: Diagnosis not present

## 2018-07-08 DIAGNOSIS — M130 Polyarthritis, unspecified: Secondary | ICD-10-CM | POA: Diagnosis not present

## 2018-07-08 DIAGNOSIS — I10 Essential (primary) hypertension: Secondary | ICD-10-CM | POA: Diagnosis not present

## 2018-10-03 DIAGNOSIS — G4733 Obstructive sleep apnea (adult) (pediatric): Secondary | ICD-10-CM | POA: Diagnosis not present

## 2018-10-03 DIAGNOSIS — I10 Essential (primary) hypertension: Secondary | ICD-10-CM | POA: Diagnosis not present

## 2018-10-03 DIAGNOSIS — Z6837 Body mass index (BMI) 37.0-37.9, adult: Secondary | ICD-10-CM | POA: Diagnosis not present

## 2018-10-03 DIAGNOSIS — M130 Polyarthritis, unspecified: Secondary | ICD-10-CM | POA: Diagnosis not present

## 2018-10-03 DIAGNOSIS — E782 Mixed hyperlipidemia: Secondary | ICD-10-CM | POA: Diagnosis not present

## 2018-10-03 DIAGNOSIS — E119 Type 2 diabetes mellitus without complications: Secondary | ICD-10-CM | POA: Diagnosis not present

## 2018-10-03 DIAGNOSIS — E6609 Other obesity due to excess calories: Secondary | ICD-10-CM | POA: Diagnosis not present

## 2018-10-03 DIAGNOSIS — E785 Hyperlipidemia, unspecified: Secondary | ICD-10-CM | POA: Diagnosis not present

## 2018-10-07 DIAGNOSIS — I4891 Unspecified atrial fibrillation: Secondary | ICD-10-CM | POA: Diagnosis not present

## 2018-10-07 DIAGNOSIS — E782 Mixed hyperlipidemia: Secondary | ICD-10-CM | POA: Diagnosis not present

## 2018-10-07 DIAGNOSIS — L03116 Cellulitis of left lower limb: Secondary | ICD-10-CM | POA: Diagnosis not present

## 2018-10-07 DIAGNOSIS — F319 Bipolar disorder, unspecified: Secondary | ICD-10-CM | POA: Diagnosis not present

## 2018-10-07 DIAGNOSIS — E1169 Type 2 diabetes mellitus with other specified complication: Secondary | ICD-10-CM | POA: Diagnosis not present

## 2018-10-07 DIAGNOSIS — R7309 Other abnormal glucose: Secondary | ICD-10-CM | POA: Diagnosis not present

## 2018-10-07 DIAGNOSIS — E11621 Type 2 diabetes mellitus with foot ulcer: Secondary | ICD-10-CM | POA: Diagnosis not present

## 2018-10-07 DIAGNOSIS — R2689 Other abnormalities of gait and mobility: Secondary | ICD-10-CM | POA: Diagnosis not present

## 2018-10-07 DIAGNOSIS — J449 Chronic obstructive pulmonary disease, unspecified: Secondary | ICD-10-CM | POA: Diagnosis not present

## 2018-10-07 DIAGNOSIS — J9611 Chronic respiratory failure with hypoxia: Secondary | ICD-10-CM | POA: Diagnosis not present

## 2018-10-07 DIAGNOSIS — R278 Other lack of coordination: Secondary | ICD-10-CM | POA: Diagnosis not present

## 2018-10-07 DIAGNOSIS — E785 Hyperlipidemia, unspecified: Secondary | ICD-10-CM | POA: Diagnosis not present

## 2018-10-07 DIAGNOSIS — L89329 Pressure ulcer of left buttock, unspecified stage: Secondary | ICD-10-CM | POA: Diagnosis not present

## 2018-10-07 DIAGNOSIS — I5033 Acute on chronic diastolic (congestive) heart failure: Secondary | ICD-10-CM | POA: Diagnosis not present

## 2018-10-07 DIAGNOSIS — M130 Polyarthritis, unspecified: Secondary | ICD-10-CM | POA: Diagnosis not present

## 2018-10-07 DIAGNOSIS — G4733 Obstructive sleep apnea (adult) (pediatric): Secondary | ICD-10-CM | POA: Diagnosis not present

## 2018-10-07 DIAGNOSIS — I10 Essential (primary) hypertension: Secondary | ICD-10-CM | POA: Diagnosis not present

## 2018-11-05 DIAGNOSIS — F064 Anxiety disorder due to known physiological condition: Secondary | ICD-10-CM | POA: Diagnosis not present

## 2018-11-05 DIAGNOSIS — E1169 Type 2 diabetes mellitus with other specified complication: Secondary | ICD-10-CM | POA: Diagnosis not present

## 2018-11-05 DIAGNOSIS — I10 Essential (primary) hypertension: Secondary | ICD-10-CM | POA: Diagnosis not present

## 2018-11-05 DIAGNOSIS — E782 Mixed hyperlipidemia: Secondary | ICD-10-CM | POA: Diagnosis not present

## 2018-11-12 DIAGNOSIS — K219 Gastro-esophageal reflux disease without esophagitis: Secondary | ICD-10-CM | POA: Diagnosis not present

## 2018-11-12 DIAGNOSIS — F064 Anxiety disorder due to known physiological condition: Secondary | ICD-10-CM | POA: Diagnosis not present

## 2019-01-26 ENCOUNTER — Other Ambulatory Visit: Payer: Self-pay

## 2019-01-29 DIAGNOSIS — E1169 Type 2 diabetes mellitus with other specified complication: Secondary | ICD-10-CM | POA: Diagnosis not present

## 2019-01-29 DIAGNOSIS — E782 Mixed hyperlipidemia: Secondary | ICD-10-CM | POA: Diagnosis not present

## 2019-01-29 DIAGNOSIS — I10 Essential (primary) hypertension: Secondary | ICD-10-CM | POA: Diagnosis not present

## 2019-02-03 DIAGNOSIS — E1169 Type 2 diabetes mellitus with other specified complication: Secondary | ICD-10-CM | POA: Diagnosis not present

## 2019-02-03 DIAGNOSIS — F064 Anxiety disorder due to known physiological condition: Secondary | ICD-10-CM | POA: Diagnosis not present

## 2019-02-03 DIAGNOSIS — E785 Hyperlipidemia, unspecified: Secondary | ICD-10-CM | POA: Diagnosis not present

## 2019-02-03 DIAGNOSIS — K219 Gastro-esophageal reflux disease without esophagitis: Secondary | ICD-10-CM | POA: Diagnosis not present

## 2019-02-03 DIAGNOSIS — I10 Essential (primary) hypertension: Secondary | ICD-10-CM | POA: Diagnosis not present

## 2019-02-03 DIAGNOSIS — Z0000 Encounter for general adult medical examination without abnormal findings: Secondary | ICD-10-CM | POA: Diagnosis not present

## 2019-02-14 DIAGNOSIS — Z20828 Contact with and (suspected) exposure to other viral communicable diseases: Secondary | ICD-10-CM | POA: Diagnosis not present

## 2019-02-18 ENCOUNTER — Ambulatory Visit
Admission: EM | Admit: 2019-02-18 | Discharge: 2019-02-18 | Disposition: A | Discharge status: Home / Self Care | Payer: PPO

## 2019-02-18 ENCOUNTER — Other Ambulatory Visit: Payer: Self-pay

## 2019-02-18 DIAGNOSIS — B029 Zoster without complications: Secondary | ICD-10-CM | POA: Diagnosis not present

## 2019-02-18 DIAGNOSIS — L299 Pruritus, unspecified: Secondary | ICD-10-CM

## 2019-02-18 MED ORDER — PREDNISONE 20 MG PO TABS: 20.0000 mg | ORAL_TABLET | Freq: Two times a day (BID) | ORAL | 0 refills | Status: AC

## 2019-02-18 MED ORDER — VALACYCLOVIR HCL 1 G PO TABS: 1000.0000 mg | ORAL_TABLET | Freq: Three times a day (TID) | ORAL | 0 refills | Status: AC

## 2019-02-18 NOTE — ED Provider Notes (Signed)
[facility]   [id number] 02/18/19 Arrival Time: [id number]  CC: Rash  SUBJECTIVE:  Ashley Clements is a 73 y.o. female who presents with a rash to left shoulder and arm that occurred 2 days ago.  Denies precipitating event or trauma.  Denies changes in soaps, detergents, close contacts with similar rash, environmental trigger, or allergy. Denies medications change or starting a new medication recently.  Localizes the rash to left shoulder and arm.  Describes it as painful, red, and itchy.  Has tried benadryl without relief.  Symptoms are made worse to the touch.  Denies similar symptoms in the past.  Denies fever, chills, nausea, vomiting, swelling, discharge, SOB, chest pain, abdominal pain, changes in bowel or bladder function.    ROS: As per HPI.  All other pertinent ROS negative.     Past Medical History:  Diagnosis Date  . Anemia   . Arthritis   . Diabetes mellitus    borderline  . Hypertension    Past Surgical History:  Procedure Laterality Date  . ABDOMINAL HYSTERECTOMY    . CHOLECYSTECTOMY    . EYE SURGERY     Cataract left, Retina  detachment left  . TOTAL KNEE ARTHROPLASTY  04/03/2012   Procedure: TOTAL KNEE ARTHROPLASTY;  Surgeon: Sharmon Revere, MD;  Location: [facility];  Service: Orthopedics;  Laterality: Right;   Allergies  Allergen Reactions  . Latex Rash  . Peanut-Containing Drug Products Rash    All nuts   No current facility-administered medications on file prior to encounter.    Current Outpatient Medications on File Prior to Encounter  Medication Sig Dispense Refill  . citalopram (CELEXA) 10 MG tablet Take 10 mg by mouth daily.    Marland Kitchen acetaminophen (TYLENOL) 500 MG tablet Take 250 mg by mouth every 6 (six) hours as needed for moderate pain.    . Multiple Vitamins-Minerals (MULTIVITAMINS THER. W/MINERALS) TABS tablet Take by mouth.    . valsartan-hydrochlorothiazide (DIOVAN-HCT) 80-12.5 MG per tablet Take 1 tablet by mouth daily.    . [DISCONTINUED]  ferrous sulfate 325 (65 FE) MG tablet Take 1 tablet (325 mg total) by mouth 3 (three) times daily after meals. 60 tablet 1  . [DISCONTINUED] omeprazole (PRILOSEC) 40 MG capsule Take 1 capsule (40 mg total) by mouth 2 (two) times daily. 28 capsule 0  . [DISCONTINUED] rosuvastatin (CRESTOR) 20 MG tablet Take 20 mg by mouth daily.     Social History   Socioeconomic History  . Marital status: Married    Spouse name: Not on file  . Number of children: Not on file  . Years of education: Not on file  . Highest education level: Not on file  Occupational History  . Not on file  Social Needs  . Financial resource strain: Not on file  . Food insecurity    Worry: Not on file    Inability: Not on file  . Transportation needs    Medical: Not on file    Non-medical: Not on file  Tobacco Use  . Smoking status: Never Smoker  . Smokeless tobacco: Never Used  Substance and Sexual Activity  . Alcohol use: No  . Drug use: No  . Sexual activity: Not on file  Lifestyle  . Physical activity    Days per week: Not on file    Minutes per session: Not on file  . Stress: Not on file  Relationships  . Social connections    Talks on phone: Not on file  Gets together: Not on file    Attends religious service: Not on file    Active member of club or organization: Not on file    Attends meetings of clubs or organizations: Not on file    Relationship status: Not on file  . Intimate partner violence    Fear of current or ex partner: Not on file    Emotionally abused: Not on file    Physically abused: Not on file    Forced sexual activity: Not on file  Other Topics Concern  . Not on file  Social History Narrative  . Not on file   History reviewed. No pertinent family history.  OBJECTIVE: Vitals:   02/18/19 1100  BP: 127/71  Pulse: 65  Resp: 18  Temp: 98.8 F (37.1 C)  SpO2: 97%    General appearance: alert; no distress Head: NCAT Lungs: clear to auscultation bilaterally; normal  respiratory effort Heart: regular rate and rhythm.   Extremities: no edema Skin: warm and dry; Crops of erythematous papules and macules over C6 dermatome on the left side; no obvious drainage, bleeding, or blisters; TTP Psychological: alert and cooperative; normal mood and affect  ASSESSMENT & PLAN:  1. Herpes zoster without complication   2. Itching     Meds ordered this encounter  Medications  . predniSONE (DELTASONE) 20 MG tablet    Sig: Take 1 tablet (20 mg total) by mouth 2 (two) times daily with a meal for 5 days.    Dispense:  10 tablet    Refill:  0    Order Specific Question:   Supervising Provider    Answer:   Raylene Everts [id number]  . valACYclovir (VALTREX) 1000 MG tablet    Sig: Take 1 tablet (1,000 mg total) by mouth 3 (three) times daily for 10 days.    Dispense:  30 tablet    Refill:  0    Order Specific Question:   Supervising Provider    Answer:   Raylene Everts [id number]   Rest and use ice/heat as needed for symptomatic relief Prescribed valacyclovir 1000mg  3x/day for 10 days Prescribed prednisone taper for inflammation and pain Use OTC medications such as ibuprofen/ tylenol.   Follow up with PCP in 7-10 days if rash is still present Follow up with PCP if symptoms of burning, stinging, tingling or numbness occur after rash resolves, you may need additional treatment Return here or go to ER if you have any new or worsening symptoms (such as eye involvement, severe pain, or signs of secondary infection such as fever, chills, nausea, vomiting, discharge, redness or warmth over site of rash, etc...)   Reviewed expectations re: course of current medical issues. Questions answered. Outlined signs and symptoms indicating need for more acute intervention. Patient verbalized understanding. After Visit Summary given.   Lestine Box, PA-C 02/18/19 1123

## 2019-02-18 NOTE — Discharge Instructions (Addendum)
Rest and use ice/heat as needed for symptomatic relief Prescribed valacyclovir 1000mg  3x/day for 10 days Prescribed prednisone taper for inflammation and pain Use OTC medications such as ibuprofen/ tylenol.   Follow up with PCP in 7-10 days if rash is still present Follow up with PCP if symptoms of burning, stinging, tingling or numbness occur after rash resolves, you may need additional treatment Return here or go to ER if you have any new or worsening symptoms (such as eye involvement, severe pain, or signs of secondary infection such as fever, chills, nausea, vomiting, discharge, redness or warmth over site of rash, etc...)

## 2019-02-18 NOTE — ED Triage Notes (Signed)
Pt has 2 areas on left arm and shoulder that are red and painful

## 2019-04-13 DIAGNOSIS — Z20828 Contact with and (suspected) exposure to other viral communicable diseases: Secondary | ICD-10-CM | POA: Diagnosis not present

## 2019-05-08 ENCOUNTER — Other Ambulatory Visit (HOSPITAL_COMMUNITY): Payer: Self-pay | Admitting: Family Medicine

## 2019-05-08 DIAGNOSIS — Z1231 Encounter for screening mammogram for malignant neoplasm of breast: Secondary | ICD-10-CM

## 2019-05-20 ENCOUNTER — Other Ambulatory Visit: Payer: Self-pay

## 2019-06-05 DIAGNOSIS — I10 Essential (primary) hypertension: Secondary | ICD-10-CM | POA: Diagnosis not present

## 2019-06-05 DIAGNOSIS — E1169 Type 2 diabetes mellitus with other specified complication: Secondary | ICD-10-CM | POA: Diagnosis not present

## 2019-06-05 DIAGNOSIS — E785 Hyperlipidemia, unspecified: Secondary | ICD-10-CM | POA: Diagnosis not present

## 2019-06-05 DIAGNOSIS — R7309 Other abnormal glucose: Secondary | ICD-10-CM | POA: Diagnosis not present

## 2019-06-09 DIAGNOSIS — Z961 Presence of intraocular lens: Secondary | ICD-10-CM | POA: Diagnosis not present

## 2019-06-09 DIAGNOSIS — H524 Presbyopia: Secondary | ICD-10-CM | POA: Diagnosis not present

## 2019-06-09 DIAGNOSIS — H5213 Myopia, bilateral: Secondary | ICD-10-CM | POA: Diagnosis not present

## 2019-06-09 DIAGNOSIS — E785 Hyperlipidemia, unspecified: Secondary | ICD-10-CM | POA: Diagnosis not present

## 2019-06-09 DIAGNOSIS — H2511 Age-related nuclear cataract, right eye: Secondary | ICD-10-CM | POA: Diagnosis not present

## 2019-06-09 DIAGNOSIS — H52203 Unspecified astigmatism, bilateral: Secondary | ICD-10-CM | POA: Diagnosis not present

## 2019-06-09 DIAGNOSIS — H25011 Cortical age-related cataract, right eye: Secondary | ICD-10-CM | POA: Diagnosis not present

## 2019-06-09 DIAGNOSIS — I10 Essential (primary) hypertension: Secondary | ICD-10-CM | POA: Diagnosis not present

## 2019-06-09 DIAGNOSIS — R079 Chest pain, unspecified: Secondary | ICD-10-CM | POA: Diagnosis not present

## 2019-06-15 ENCOUNTER — Ambulatory Visit (HOSPITAL_COMMUNITY)
Admission: RE | Admit: 2019-06-15 | Discharge: 2019-06-15 | Disposition: A | Discharge status: Home / Self Care | Payer: PPO | Source: Ambulatory Visit | Attending: Family Medicine | Admitting: Family Medicine

## 2019-06-15 ENCOUNTER — Other Ambulatory Visit: Payer: Self-pay

## 2019-06-15 DIAGNOSIS — Z1231 Encounter for screening mammogram for malignant neoplasm of breast: Secondary | ICD-10-CM | POA: Diagnosis not present

## 2019-07-07 DIAGNOSIS — M79651 Pain in right thigh: Secondary | ICD-10-CM | POA: Diagnosis not present

## 2019-07-23 ENCOUNTER — Ambulatory Visit: Payer: PPO

## 2019-08-03 ENCOUNTER — Ambulatory Visit: Payer: PPO

## 2019-10-02 DIAGNOSIS — E1169 Type 2 diabetes mellitus with other specified complication: Secondary | ICD-10-CM | POA: Diagnosis not present

## 2019-10-02 DIAGNOSIS — E785 Hyperlipidemia, unspecified: Secondary | ICD-10-CM | POA: Diagnosis not present

## 2019-10-02 DIAGNOSIS — I10 Essential (primary) hypertension: Secondary | ICD-10-CM | POA: Diagnosis not present

## 2019-10-06 DIAGNOSIS — M130 Polyarthritis, unspecified: Secondary | ICD-10-CM | POA: Diagnosis not present

## 2019-10-06 DIAGNOSIS — I10 Essential (primary) hypertension: Secondary | ICD-10-CM | POA: Diagnosis not present

## 2019-10-06 DIAGNOSIS — E1169 Type 2 diabetes mellitus with other specified complication: Secondary | ICD-10-CM | POA: Diagnosis not present

## 2020-01-01 DIAGNOSIS — E1169 Type 2 diabetes mellitus with other specified complication: Secondary | ICD-10-CM | POA: Diagnosis not present

## 2020-01-01 DIAGNOSIS — E782 Mixed hyperlipidemia: Secondary | ICD-10-CM | POA: Diagnosis not present

## 2020-01-01 DIAGNOSIS — M130 Polyarthritis, unspecified: Secondary | ICD-10-CM | POA: Diagnosis not present

## 2020-01-01 DIAGNOSIS — I10 Essential (primary) hypertension: Secondary | ICD-10-CM | POA: Diagnosis not present

## 2020-01-05 DIAGNOSIS — M130 Polyarthritis, unspecified: Secondary | ICD-10-CM | POA: Diagnosis not present

## 2020-01-05 DIAGNOSIS — E1169 Type 2 diabetes mellitus with other specified complication: Secondary | ICD-10-CM | POA: Diagnosis not present

## 2020-01-05 DIAGNOSIS — I10 Essential (primary) hypertension: Secondary | ICD-10-CM | POA: Diagnosis not present

## 2020-01-07 IMAGING — MG DIGITAL SCREENING BILATERAL MAMMOGRAM WITH TOMO AND CAD
8 series · 8 of 24 positions shown · non-contrast
Comparison: Previous exam(s).

CLINICAL DATA: Screening.

EXAM:
DIGITAL SCREENING BILATERAL MAMMOGRAM WITH TOMO AND CAD

[L MLO synth-2D]
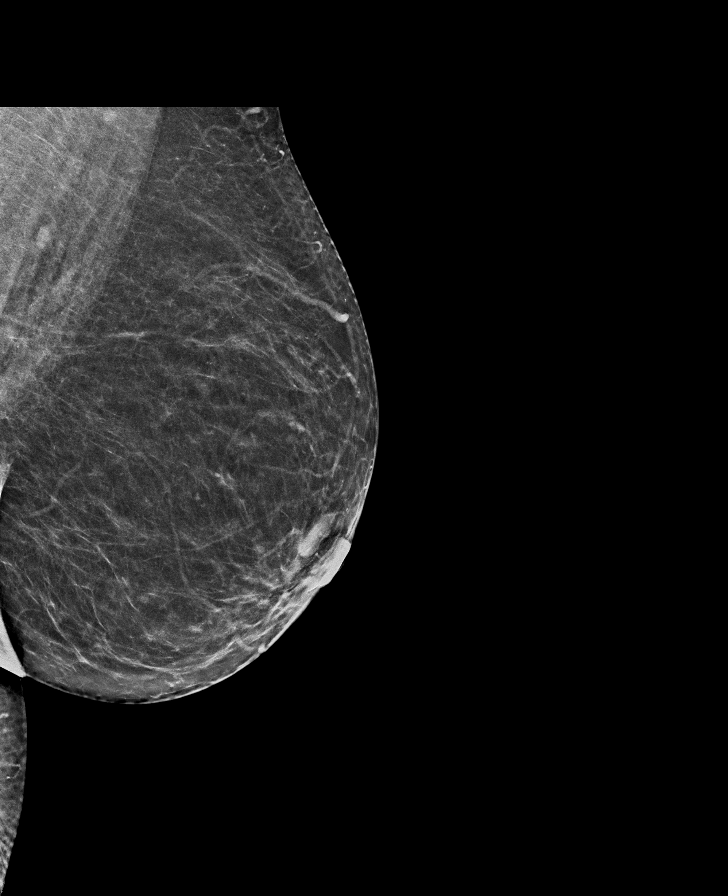

[L CC synth-2D]
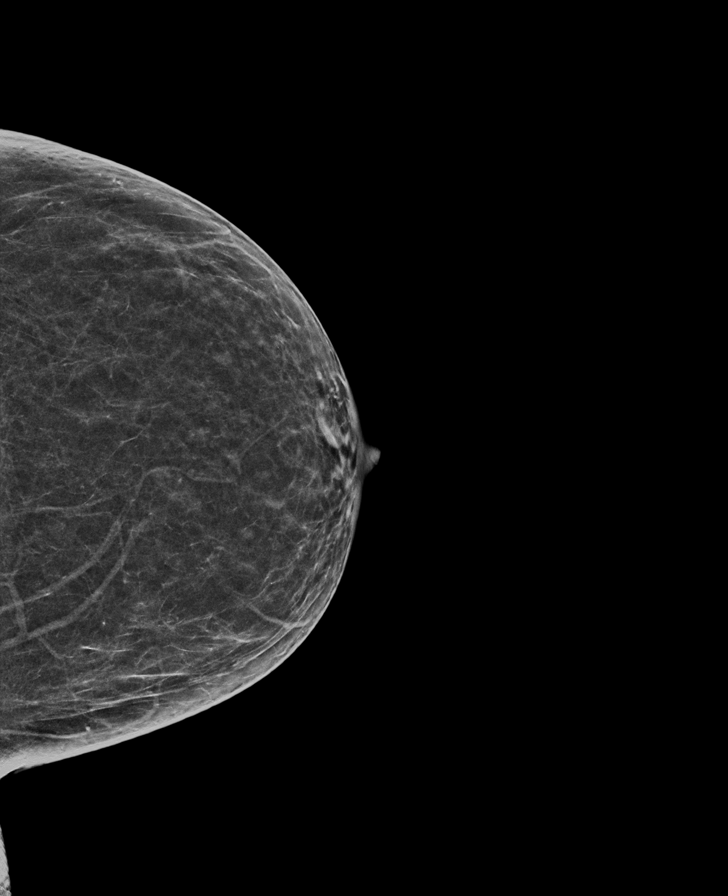

[R CC synth-2D]
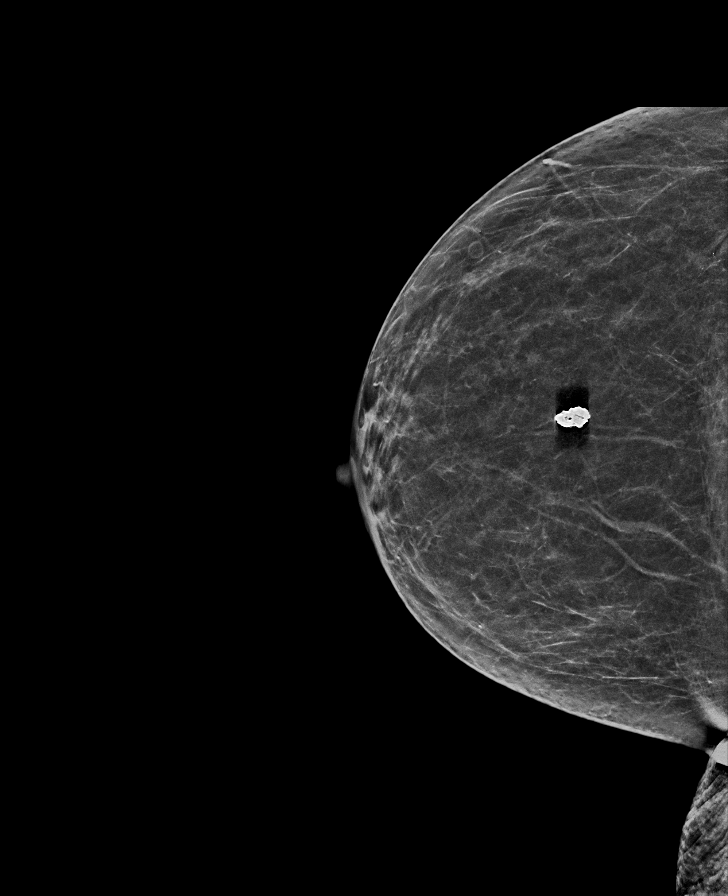

[R MLO synth-2D]
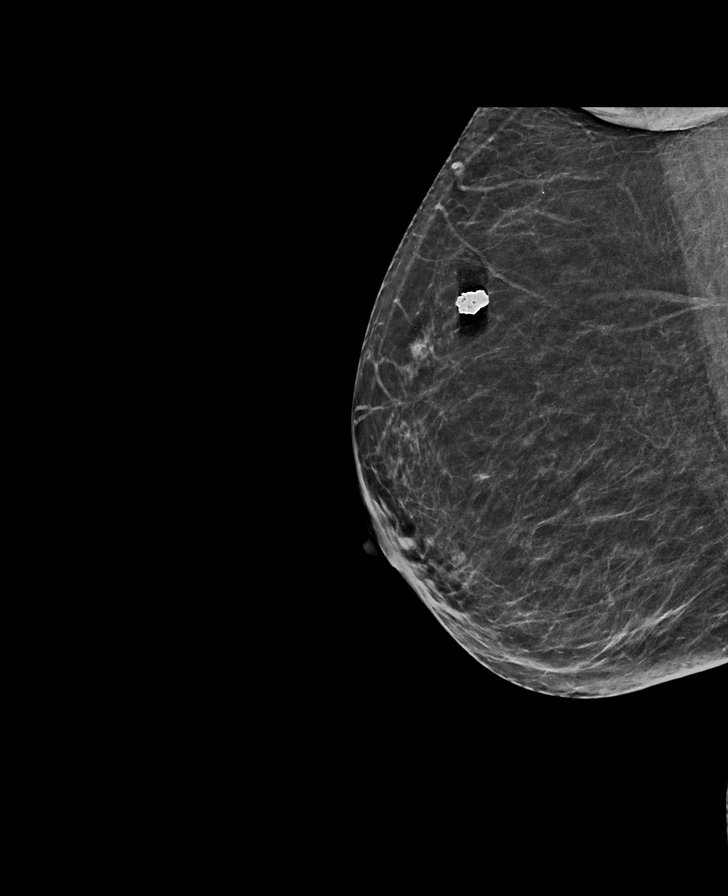

[R MLO tomo · tomo slice 31/62.0]
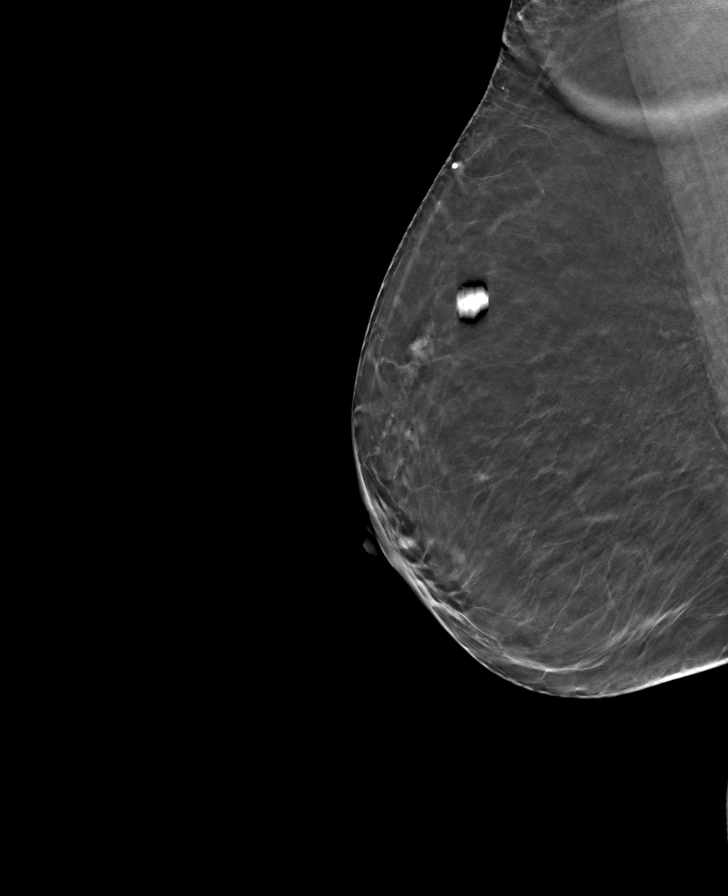

[R CC tomo · tomo slice 31/60.0]
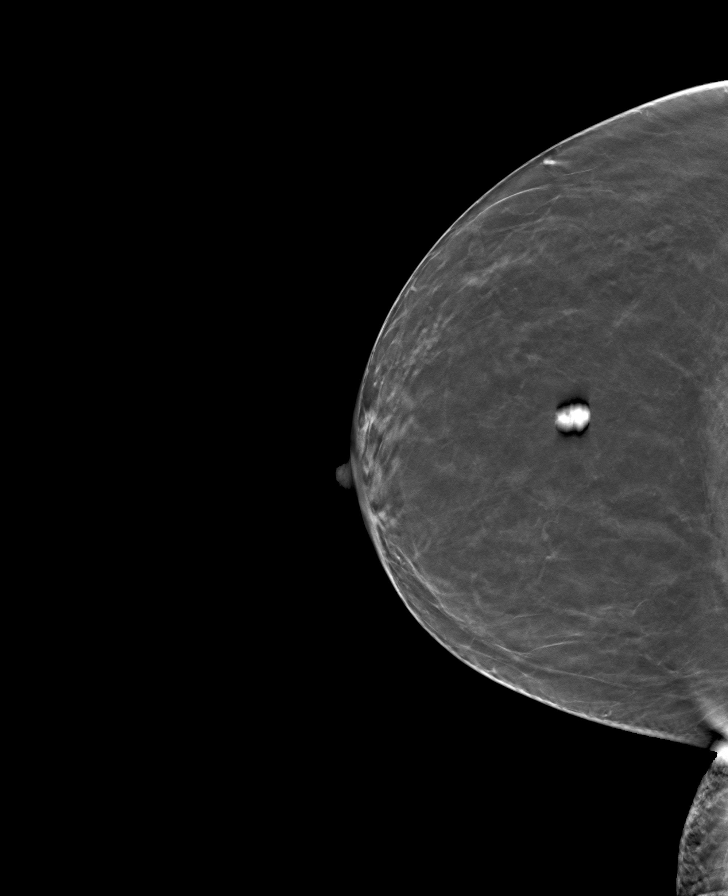

[L MLO tomo · tomo slice 31/61.0]
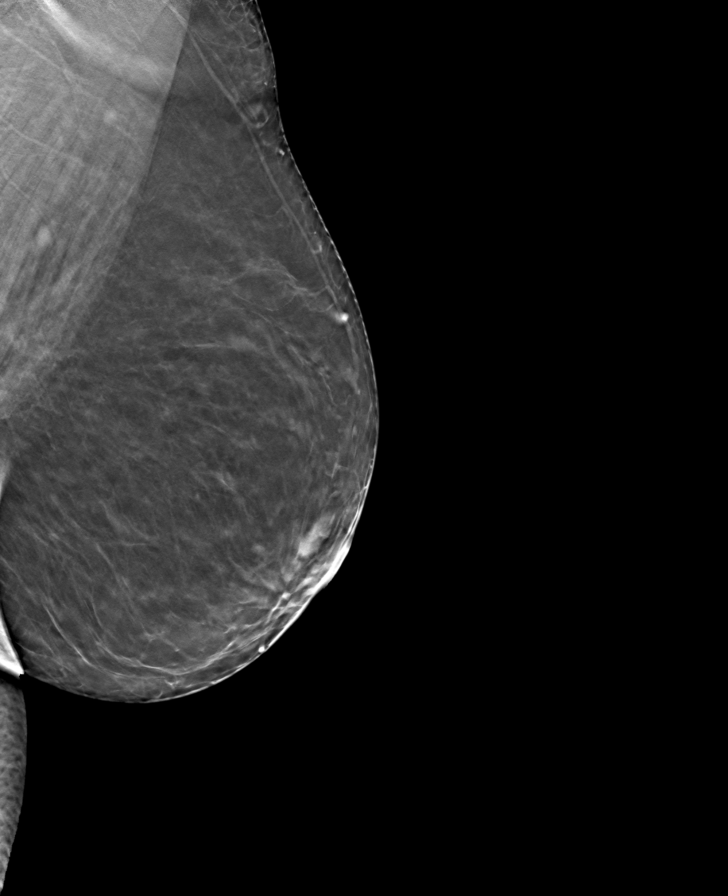

[L CC tomo · tomo slice 29/58.0]
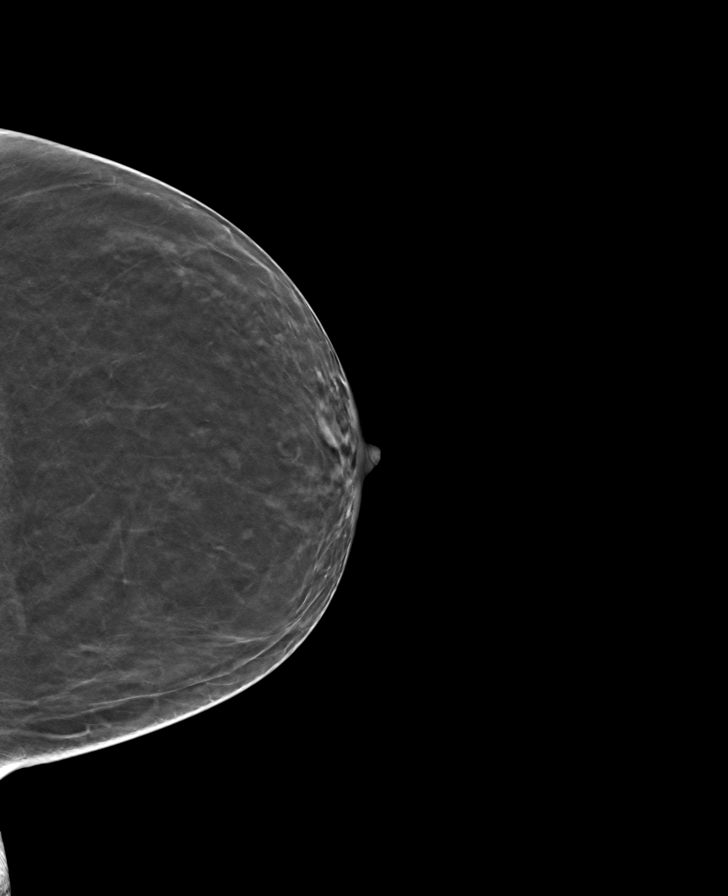

[8 of 24 positions shown; findings below may reference images not displayed]

ACR Breast Density Category b: There are scattered areas of
fibroglandular density.
FINDINGS: There are no findings suspicious for malignancy. Images were
processed with CAD.
IMPRESSION: No mammographic evidence of malignancy. A result letter of this
screening mammogram will be mailed directly to the patient.

RECOMMENDATION:
Screening mammogram in one year. (Code:[id number])

BI-RADS CATEGORY  1: Negative.

## 2020-01-19 ENCOUNTER — Other Ambulatory Visit: Payer: Self-pay

## 2020-03-24 ENCOUNTER — Ambulatory Visit: Payer: PPO | Attending: Internal Medicine

## 2020-03-24 DIAGNOSIS — Z23 Encounter for immunization: Secondary | ICD-10-CM

## 2020-03-24 NOTE — Progress Notes (Signed)
   Covid-19 Vaccination Clinic  Name:  [name]    MRN: [id number] DOB: Apr 27, 1946  03/24/2020  [name] was observed post Covid-19 immunization for 15 minutes without incident. She was provided with Vaccine Information Sheet and instruction to access the V-Safe system.   [name] was instructed to call 911 with any severe reactions post vaccine: Marland Kitchen Difficulty breathing  . Swelling of face and throat  . A fast heartbeat  . A bad rash all over body  . Dizziness and weakness

## 2020-04-05 DIAGNOSIS — E782 Mixed hyperlipidemia: Secondary | ICD-10-CM | POA: Diagnosis not present

## 2020-04-05 DIAGNOSIS — I10 Essential (primary) hypertension: Secondary | ICD-10-CM | POA: Diagnosis not present

## 2020-04-05 DIAGNOSIS — E1169 Type 2 diabetes mellitus with other specified complication: Secondary | ICD-10-CM | POA: Diagnosis not present

## 2020-04-05 DIAGNOSIS — F064 Anxiety disorder due to known physiological condition: Secondary | ICD-10-CM | POA: Diagnosis not present

## 2020-04-05 DIAGNOSIS — R402240 Coma scale, best verbal response, confused conversation, unspecified time: Secondary | ICD-10-CM | POA: Diagnosis not present

## 2020-04-05 DIAGNOSIS — Z0000 Encounter for general adult medical examination without abnormal findings: Secondary | ICD-10-CM | POA: Diagnosis not present

## 2020-04-05 DIAGNOSIS — E1100 Type 2 diabetes mellitus with hyperosmolarity without nonketotic hyperglycemic-hyperosmolar coma (NKHHC): Secondary | ICD-10-CM | POA: Diagnosis not present

## 2020-05-06 DIAGNOSIS — R7303 Prediabetes: Secondary | ICD-10-CM | POA: Diagnosis not present

## 2020-05-06 DIAGNOSIS — E782 Mixed hyperlipidemia: Secondary | ICD-10-CM | POA: Diagnosis not present

## 2020-05-06 DIAGNOSIS — R258 Other abnormal involuntary movements: Secondary | ICD-10-CM | POA: Diagnosis not present

## 2020-05-06 DIAGNOSIS — I10 Essential (primary) hypertension: Secondary | ICD-10-CM | POA: Diagnosis not present

## 2020-05-12 ENCOUNTER — Other Ambulatory Visit (HOSPITAL_COMMUNITY): Payer: Self-pay | Admitting: Family Medicine

## 2020-05-12 DIAGNOSIS — Z1231 Encounter for screening mammogram for malignant neoplasm of breast: Secondary | ICD-10-CM

## 2020-06-20 ENCOUNTER — Ambulatory Visit (HOSPITAL_COMMUNITY)
Admission: RE | Admit: 2020-06-20 | Discharge: 2020-06-20 | Disposition: A | Discharge status: Home / Self Care | Payer: PPO | Source: Ambulatory Visit | Attending: Family Medicine | Admitting: Family Medicine

## 2020-06-20 ENCOUNTER — Other Ambulatory Visit: Payer: Self-pay

## 2020-06-20 DIAGNOSIS — Z1231 Encounter for screening mammogram for malignant neoplasm of breast: Secondary | ICD-10-CM | POA: Diagnosis not present

## 2020-06-23 IMAGING — US US CAROTID DUPLEX BILAT
1 series · 13 of 24 positions shown · non-contrast
Comparison: None.

CLINICAL DATA: 72-year-old female with a history of syncope

EXAM:
BILATERAL CAROTID DUPLEX ULTRASOUND
TECHNIQUE: Gray scale imaging, color Doppler and duplex ultrasound were
performed of bilateral carotid and vertebral arteries in the neck.

[Series 1: us carotid duplex bilat · 0.07mm/px · 13 of 80 slices shown]
[im 1/80]
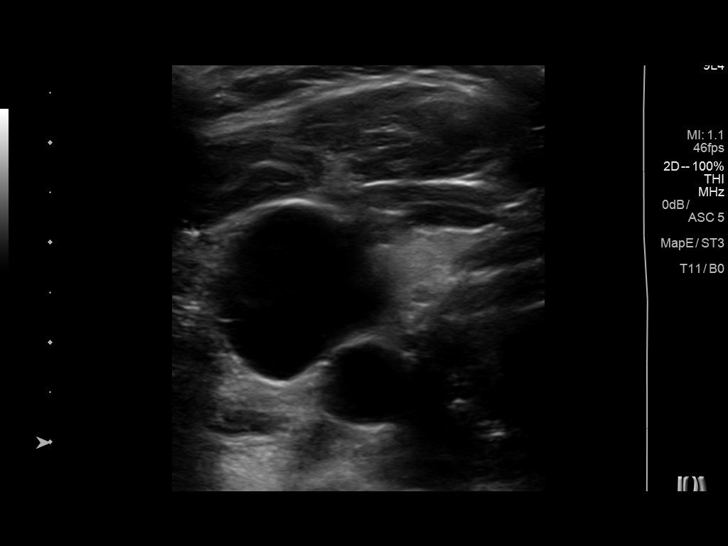
[im 7/80]
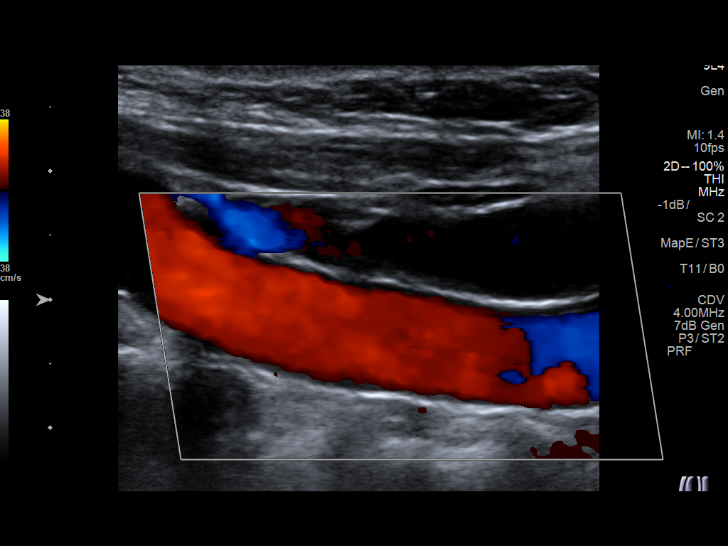
[im 14/80]
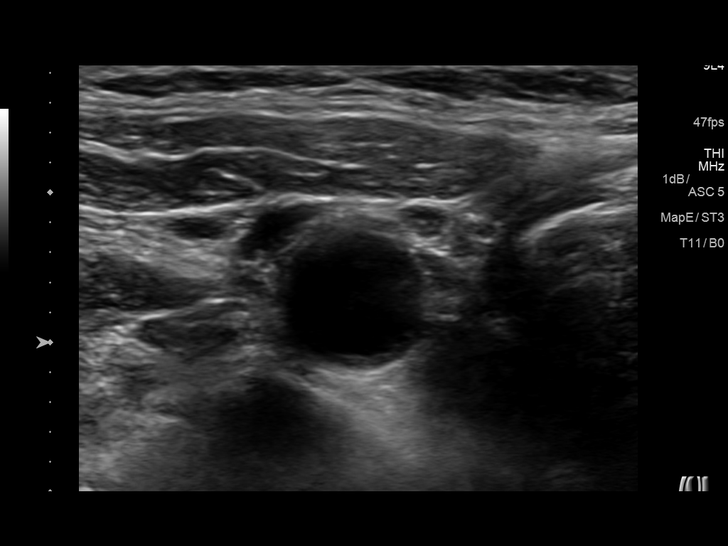
[im 21/80]
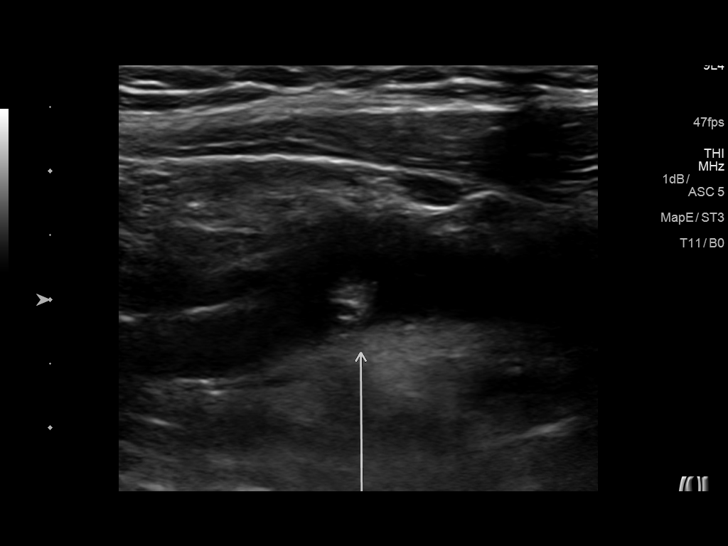
[im 28/80]
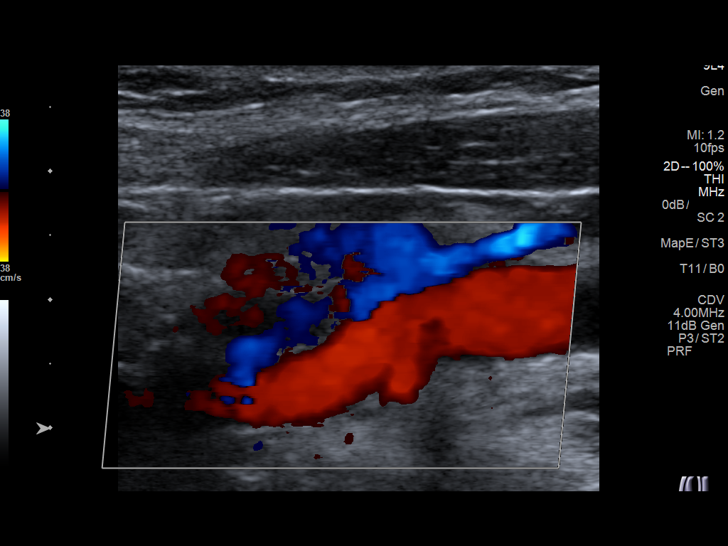
[im 35/80]
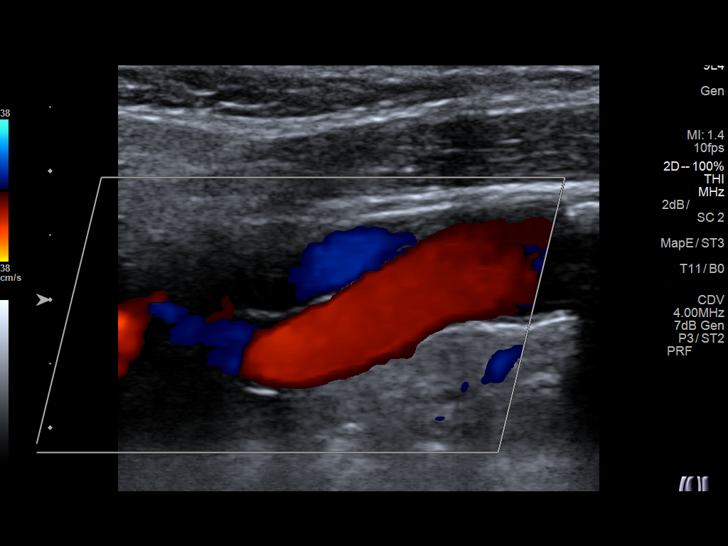
[im 42/80]
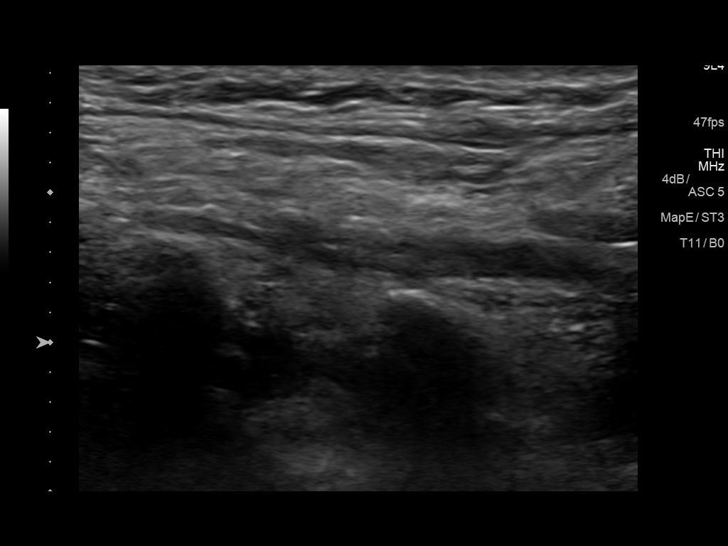
[im 45/80]
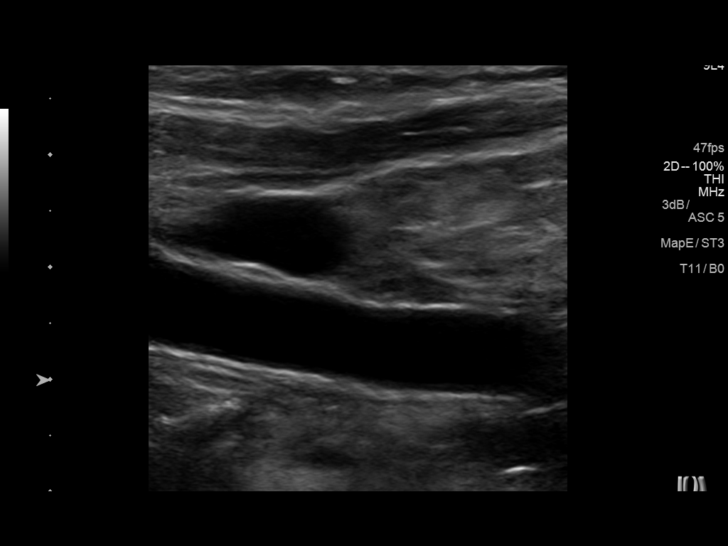
[im 52/80]
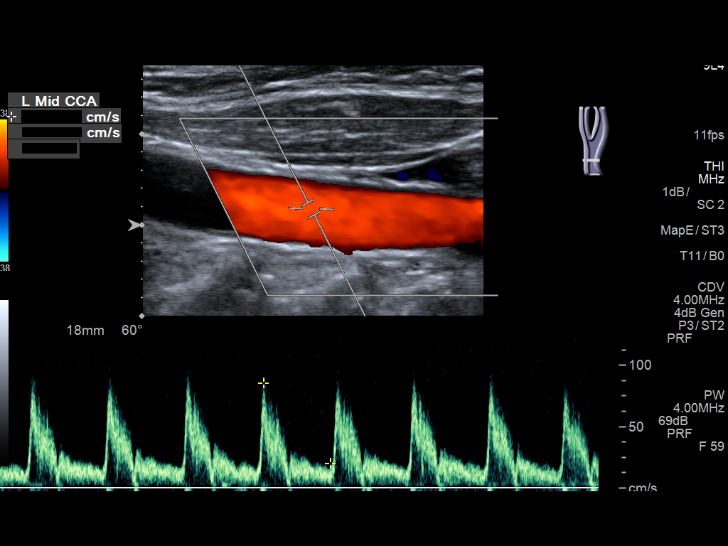
[im 59/80]
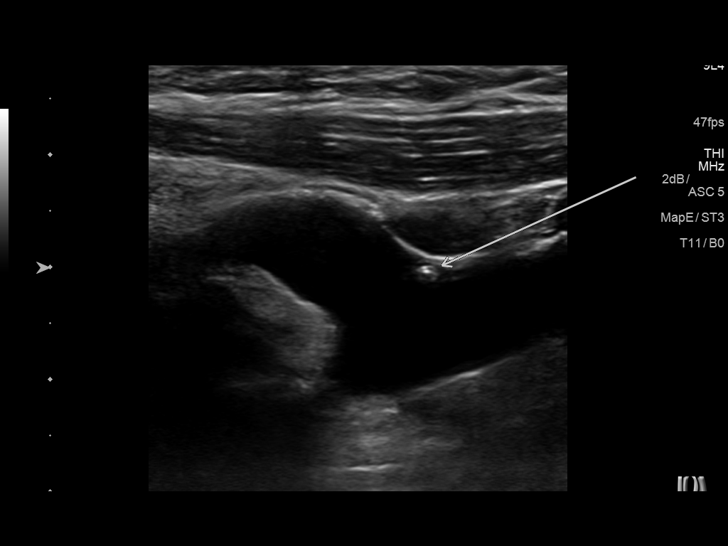
[im 66/80]
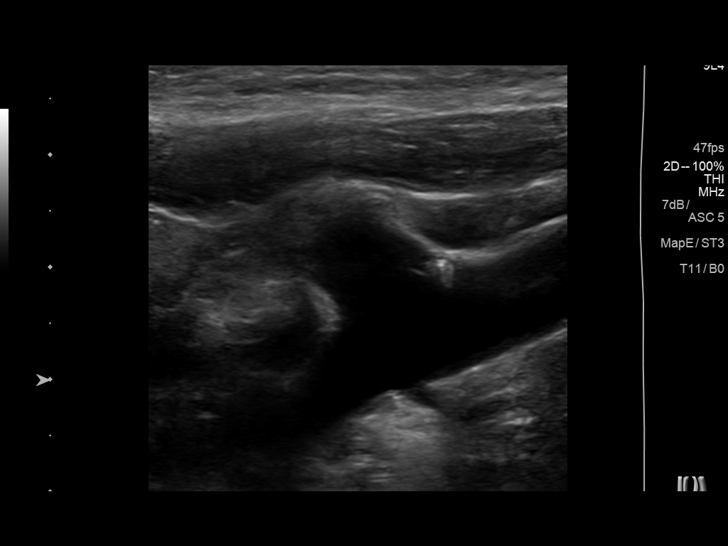
[im 73/80]
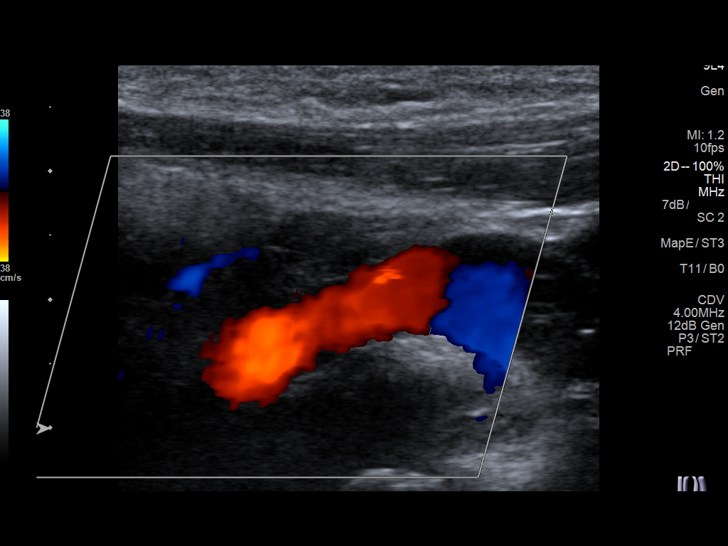
[im 80/80]
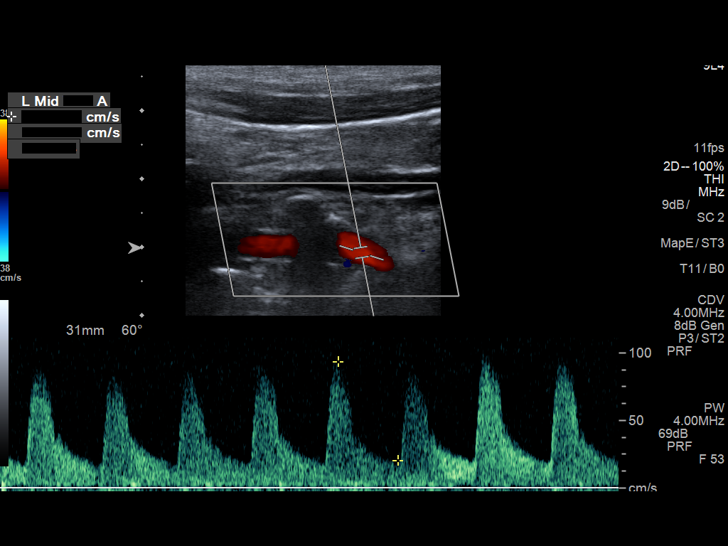

[13 of 24 positions shown; findings below may reference images not displayed]

FINDINGS: Criteria: Quantification of carotid stenosis is based on velocity
parameters that correlate the residual internal carotid diameter
with NASCET-based stenosis levels, using the diameter of the distal
internal carotid lumen as the denominator for stenosis measurement.

The following velocity measurements were obtained:

RIGHT

ICA:  Systolic 90 cm/sec, Diastolic 21 cm/sec

CCA:  68 cm/sec

SYSTOLIC ICA/CCA RATIO:

ECA:  76 cm/sec

LEFT

ICA:  Systolic 86 cm/sec, Diastolic 31 cm/sec

CCA:  86 cm/sec

SYSTOLIC ICA/CCA RATIO:

ECA:  82 cm/sec

Right Brachial SBP: Not acquired

Left Brachial SBP: Not acquired

RIGHT CAROTID ARTERY: No significant calcifications of the right
common carotid artery. Intermediate waveform maintained. Moderate
heterogeneous and partially calcified plaque at the right carotid
bifurcation. No significant lumen shadowing. Low resistance waveform
of the right ICA. Tortuous vessels.

RIGHT VERTEBRAL ARTERY: Antegrade flow with low resistance waveform.

LEFT CAROTID ARTERY: No significant calcifications of the left
common carotid artery. Intermediate waveform maintained. Moderate
heterogeneous and partially calcified plaque at the left carotid
bifurcation. No significant lumen shadowing. Low resistance waveform
of the left ICA. Tortuous vessels.

LEFT VERTEBRAL ARTERY:  Antegrade flow with low resistance waveform.
IMPRESSION: Color duplex indicates moderate heterogeneous and calcified plaque,
with no hemodynamically significant stenosis by duplex criteria in
the extracranial cerebrovascular circulation.

## 2020-07-06 ENCOUNTER — Ambulatory Visit
Admission: EM | Admit: 2020-07-06 | Discharge: 2020-07-06 | Disposition: A | Discharge status: Home / Self Care | Payer: PPO | Attending: Family Medicine | Admitting: Family Medicine

## 2020-07-06 ENCOUNTER — Encounter: Payer: Self-pay | Admitting: Emergency Medicine

## 2020-07-06 DIAGNOSIS — Z1152 Encounter for screening for COVID-19: Secondary | ICD-10-CM | POA: Diagnosis not present

## 2020-07-06 DIAGNOSIS — J3489 Other specified disorders of nose and nasal sinuses: Secondary | ICD-10-CM

## 2020-07-06 DIAGNOSIS — B349 Viral infection, unspecified: Secondary | ICD-10-CM | POA: Diagnosis not present

## 2020-07-06 DIAGNOSIS — R0981 Nasal congestion: Secondary | ICD-10-CM

## 2020-07-06 HISTORY — DX: Gastro-esophageal reflux disease without esophagitis: K21.9

## 2020-07-06 MED ORDER — FLUTICASONE PROPIONATE 50 MCG/ACT NA SUSP: 2.0000 | Freq: Every day | NASAL | 2 refills | Status: AC

## 2020-07-06 NOTE — Discharge Instructions (Signed)
I have sent in flonase for you to use 2 sprays daily for about 2 weeks  Your COVID test is pending.  You should self quarantine until the test result is back.    Take Tylenol or ibuprofen as needed for fever or discomfort.  Rest and keep yourself hydrated.    Follow-up with your primary care provider if your symptoms are not improving.

## 2020-07-06 NOTE — ED Provider Notes (Signed)
[facility]   [id number] 07/06/20 Arrival Time: 8101   CC: COVID symptoms  SUBJECTIVE: History from: patient and family.  Ashley Clements is a 75 y.o. female who presents with nasal congestion and rhinorrhea for the last week. Denies sick exposure to COVID, flu or strep. Denies recent travel. Has negative history of Covid. Received first Void vaccine in 02/2020. Has not taken OTC medications for this.There are no aggravating or alleviating factors. Denies previous symptoms in the past. Denies fever, chills, fatigue, sinus pain, sore throat, SOB, wheezing, chest pain, nausea, changes in bowel or bladder habits.    ROS: As per HPI.  All other pertinent ROS negative.     Past Medical History:  Diagnosis Date  . Anemia   . Arthritis   . Diabetes mellitus    borderline  . GERD (gastroesophageal reflux disease)   . Hypertension    Past Surgical History:  Procedure Laterality Date  . ABDOMINAL HYSTERECTOMY    . CHOLECYSTECTOMY    . EYE SURGERY     Cataract left, Retina  detachment left  . TOTAL KNEE ARTHROPLASTY  04/03/2012   Procedure: TOTAL KNEE ARTHROPLASTY;  Surgeon: Sharmon Revere, MD;  Location: [facility];  Service: Orthopedics;  Laterality: Right;   Allergies  Allergen Reactions  . Latex Rash  . Peanut-Containing Drug Products Rash    All nuts   No current facility-administered medications on file prior to encounter.   Current Outpatient Medications on File Prior to Encounter  Medication Sig Dispense Refill  . acetaminophen (TYLENOL) 500 MG tablet Take 250 mg by mouth every 6 (six) hours as needed for moderate pain.    . citalopram (CELEXA) 10 MG tablet Take 10 mg by mouth daily.    . Multiple Vitamins-Minerals (MULTIVITAMINS THER. W/MINERALS) TABS tablet Take by mouth.    . valsartan-hydrochlorothiazide (DIOVAN-HCT) 80-12.5 MG per tablet Take 1 tablet by mouth daily.    . [DISCONTINUED] ferrous sulfate 325 (65 FE) MG tablet Take 1 tablet (325 mg total) by  mouth 3 (three) times daily after meals. 60 tablet 1  . [DISCONTINUED] omeprazole (PRILOSEC) 40 MG capsule Take 1 capsule (40 mg total) by mouth 2 (two) times daily. 28 capsule 0  . [DISCONTINUED] rosuvastatin (CRESTOR) 20 MG tablet Take 20 mg by mouth daily.     Social History   Socioeconomic History  . Marital status: Married    Spouse name: Not on file  . Number of children: Not on file  . Years of education: Not on file  . Highest education level: Not on file  Occupational History  . Not on file  Tobacco Use  . Smoking status: Never Smoker  . Smokeless tobacco: Never Used  Vaping Use  . Vaping Use: Never used  Substance and Sexual Activity  . Alcohol use: No  . Drug use: No  . Sexual activity: Not on file  Other Topics Concern  . Not on file  Social History Narrative  . Not on file   Social Determinants of Health   Financial Resource Strain: Not on file  Food Insecurity: Not on file  Transportation Needs: Not on file  Physical Activity: Not on file  Stress: Not on file  Social Connections: Not on file  Intimate Partner Violence: Not on file   No family history on file.  OBJECTIVE:  Vitals:   07/06/20 1429 07/06/20 1437  BP:  132/73  Pulse:  96  Resp:  17  Temp:  98.6 F (37 C)  TempSrc:  Oral  SpO2:  98%  Weight: 170 lb (77.1 kg)   Height: 5\' 6"  (1.676 m)      General appearance: alert; appears fatigued, but nontoxic; speaking in full sentences and tolerating own secretions HEENT: NCAT; Ears: EACs clear, TMs pearly gray; Eyes: PERRL.  EOM grossly intact. Sinuses: nontender; Nose: nares patent with clear rhinorrhea, Throat: oropharynx erythematous, cobblestoning present, tonsils non erythematous or enlarged, uvula midline  Neck: supple without LAD Lungs: unlabored respirations, symmetrical air entry; cough: absent; no respiratory distress; CTAB Heart: regular rate and rhythm.  Radial pulses 2+ symmetrical bilaterally Skin: warm and dry Psychological:  alert and cooperative; normal mood and affect  LABS:  No results found for this or any previous visit (from the past 24 hour(s)).   ASSESSMENT & PLAN:  1. Viral illness   2. Encounter for screening for COVID-19   3. Nasal congestion   4. Rhinorrhea     Meds ordered this encounter  Medications  . fluticasone (FLONASE) 50 MCG/ACT nasal spray    Sig: Place 2 sprays into both nostrils daily.    Dispense:  9.9 mL    Refill:  2    Order Specific Question:   Supervising Provider    Answer:   Chase Picket [[id number]]   Prescribed flonase Continue supportive care at home COVID and flu testing ordered.  It will take between 2-3 days for test results. Someone will contact you regarding abnormal results.   Patient should remain in quarantine until they have received Covid results.  If negative you may resume normal activities (go back to work/school) while practicing hand hygiene, social distance, and mask wearing.  If positive, patient should remain in quarantine for at least 5 days from symptom onset AND greater than 72 hours after symptoms resolution (absence of fever without the use of fever-reducing medication and improvement in respiratory symptoms), whichever is longer Get plenty of rest and push fluids Use OTC zyrtec for nasal congestion, runny nose, and/or sore throat Use OTC flonase for nasal congestion and runny nose Use medications daily for symptom relief Use OTC medications like ibuprofen or tylenol as needed fever or pain Call or go to the ED if you have any new or worsening symptoms such as fever, worsening cough, shortness of breath, chest tightness, chest pain, turning blue, changes in mental status.  Reviewed expectations re: course of current medical issues. Questions answered. Outlined signs and symptoms indicating need for more acute intervention. Patient verbalized understanding. After Visit Summary given.         Faustino Congress, NP 07/08/20 1314

## 2020-07-06 NOTE — ED Triage Notes (Signed)
Nasal congestion x 1 week

## 2020-07-08 LAB — NOVEL CORONAVIRUS, NAA: SARS-CoV-2, NAA: NOT DETECTED

## 2020-07-08 LAB — SARS-COV-2, NAA 2 DAY TAT

## 2020-08-09 DIAGNOSIS — F064 Anxiety disorder due to known physiological condition: Secondary | ICD-10-CM | POA: Diagnosis not present

## 2020-08-09 DIAGNOSIS — R7303 Prediabetes: Secondary | ICD-10-CM | POA: Diagnosis not present

## 2020-08-09 DIAGNOSIS — K219 Gastro-esophageal reflux disease without esophagitis: Secondary | ICD-10-CM | POA: Diagnosis not present

## 2020-08-09 DIAGNOSIS — K580 Irritable bowel syndrome with diarrhea: Secondary | ICD-10-CM | POA: Diagnosis not present

## 2020-08-09 DIAGNOSIS — M130 Polyarthritis, unspecified: Secondary | ICD-10-CM | POA: Diagnosis not present

## 2020-08-09 DIAGNOSIS — E782 Mixed hyperlipidemia: Secondary | ICD-10-CM | POA: Diagnosis not present

## 2020-12-08 DIAGNOSIS — K219 Gastro-esophageal reflux disease without esophagitis: Secondary | ICD-10-CM | POA: Diagnosis not present

## 2020-12-08 DIAGNOSIS — F064 Anxiety disorder due to known physiological condition: Secondary | ICD-10-CM | POA: Diagnosis not present

## 2020-12-08 DIAGNOSIS — E782 Mixed hyperlipidemia: Secondary | ICD-10-CM | POA: Diagnosis not present

## 2020-12-08 DIAGNOSIS — E1169 Type 2 diabetes mellitus with other specified complication: Secondary | ICD-10-CM | POA: Diagnosis not present

## 2020-12-08 DIAGNOSIS — I10 Essential (primary) hypertension: Secondary | ICD-10-CM | POA: Diagnosis not present

## 2021-03-25 ENCOUNTER — Emergency Department (HOSPITAL_BASED_OUTPATIENT_CLINIC_OR_DEPARTMENT_OTHER): Payer: PPO

## 2021-03-25 ENCOUNTER — Encounter (HOSPITAL_BASED_OUTPATIENT_CLINIC_OR_DEPARTMENT_OTHER): Payer: Self-pay | Admitting: Emergency Medicine

## 2021-03-25 ENCOUNTER — Emergency Department (HOSPITAL_BASED_OUTPATIENT_CLINIC_OR_DEPARTMENT_OTHER)
Admission: EM | Admit: 2021-03-25 | Discharge: 2021-03-25 | Disposition: A | Discharge status: Home / Self Care | Payer: PPO | Attending: Emergency Medicine | Admitting: Emergency Medicine

## 2021-03-25 ENCOUNTER — Other Ambulatory Visit: Payer: Self-pay

## 2021-03-25 DIAGNOSIS — Z91010 Allergy to peanuts: Secondary | ICD-10-CM | POA: Insufficient documentation

## 2021-03-25 DIAGNOSIS — Z91040 Latex allergy status: Secondary | ICD-10-CM | POA: Insufficient documentation

## 2021-03-25 DIAGNOSIS — Z96651 Presence of right artificial knee joint: Secondary | ICD-10-CM | POA: Diagnosis not present

## 2021-03-25 DIAGNOSIS — M19031 Primary osteoarthritis, right wrist: Secondary | ICD-10-CM | POA: Diagnosis not present

## 2021-03-25 DIAGNOSIS — Z79899 Other long term (current) drug therapy: Secondary | ICD-10-CM | POA: Insufficient documentation

## 2021-03-25 DIAGNOSIS — M25531 Pain in right wrist: Secondary | ICD-10-CM | POA: Diagnosis not present

## 2021-03-25 DIAGNOSIS — E119 Type 2 diabetes mellitus without complications: Secondary | ICD-10-CM | POA: Insufficient documentation

## 2021-03-25 DIAGNOSIS — I10 Essential (primary) hypertension: Secondary | ICD-10-CM | POA: Diagnosis not present

## 2021-03-25 DIAGNOSIS — S62114A Nondisplaced fracture of triquetrum [cuneiform] bone, right wrist, initial encounter for closed fracture: Secondary | ICD-10-CM | POA: Insufficient documentation

## 2021-03-25 DIAGNOSIS — Z7951 Long term (current) use of inhaled steroids: Secondary | ICD-10-CM | POA: Insufficient documentation

## 2021-03-25 DIAGNOSIS — X58XXXA Exposure to other specified factors, initial encounter: Secondary | ICD-10-CM | POA: Diagnosis not present

## 2021-03-25 DIAGNOSIS — S6991XA Unspecified injury of right wrist, hand and finger(s), initial encounter: Secondary | ICD-10-CM | POA: Diagnosis present

## 2021-03-25 MED ORDER — IBUPROFEN 600 MG PO TABS: 600.0000 mg | ORAL_TABLET | Freq: Three times a day (TID) | ORAL | 0 refills | Status: AC | PRN

## 2021-03-25 NOTE — Discharge Instructions (Addendum)
Wear your splint.  You may remove to bathe. Short course of ibuprofen prescribed, take Tylenol as needed as directed for pain. Elevate and ice for 20 minutes at a time.  Follow up with orthopedics, call Monday to schedule an appointment. Return to the ER for fever, worsening pain, swelling, redness or any other complaints or concerns.

## 2021-03-25 NOTE — ED Notes (Signed)
Davis Gourd in room w/pt now.

## 2021-03-25 NOTE — ED Provider Notes (Signed)
[facility] DE[facility] Provider Note   CSN: [id number] Arrival date & time: 03/25/21  1457     History Chief Complaint  Patient presents with   Wrist Pain    Ashley Clements is a 75 y.o. female.  75 year old female with complaint of right wrist pain onset 2 days ago, no injuries. Pain is worse with movement, radiates somewhat up the forearm. No associated numbness/tingling. Taking IBU and a heating pack as well as a wrist brace without improvement.       Past Medical History:  Diagnosis Date   Anemia    Arthritis    Diabetes mellitus    borderline   GERD (gastroesophageal reflux disease)    Hypertension     Patient Active Problem List   Diagnosis Date Noted   Syncope 04/24/2018   Essential hypertension 04/24/2018   Arthritis of knee 04/24/2018   Right-sided chest wall pain    Knee pain 04/30/2012   Knee stiffness 04/30/2012   Difficulty in walking(719.7) 04/30/2012    Past Surgical History:  Procedure Laterality Date   ABDOMINAL HYSTERECTOMY     CHOLECYSTECTOMY     EYE SURGERY     Cataract left, Retina  detachment left   TOTAL KNEE ARTHROPLASTY  04/03/2012   Procedure: TOTAL KNEE ARTHROPLASTY;  Surgeon: Sharmon Revere, MD;  Location: [facility];  Service: Orthopedics;  Laterality: Right;     OB History   No obstetric history on file.     No family history on file.  Social History   Tobacco Use   Smoking status: Never   Smokeless tobacco: Never  Vaping Use   Vaping Use: Never used  Substance Use Topics   Alcohol use: No   Drug use: No    Home Medications Prior to Admission medications   Medication Sig Start Date End Date Taking? Authorizing Provider  ibuprofen (ADVIL) 600 MG tablet Take 1 tablet (600 mg total) by mouth every 8 (eight) hours as needed for up to 5 days. 03/25/21 03/30/21 Yes Tacy Learn, PA-C  acetaminophen (TYLENOL) 500 MG tablet Take 250 mg by mouth every 6 (six) hours as needed for moderate pain.    [provider]  citalopram (CELEXA) 10 MG tablet Take 10 mg by mouth daily.    [provider]  fluticasone (FLONASE) 50 MCG/ACT nasal spray Place 2 sprays into both nostrils daily. 07/06/20   Faustino Congress, NP  Multiple Vitamins-Minerals (MULTIVITAMINS THER. W/MINERALS) TABS tablet Take by mouth.    [provider]  valsartan-hydrochlorothiazide (DIOVAN-HCT) 80-12.5 MG per tablet Take 1 tablet by mouth daily.    [provider]  ferrous sulfate 325 (65 FE) MG tablet Take 1 tablet (325 mg total) by mouth 3 (three) times daily after meals. 04/07/12 02/18/19  Marily Memos, MD  omeprazole (PRILOSEC) 40 MG capsule Take 1 capsule (40 mg total) by mouth 2 (two) times daily. 11/09/16 02/18/19  Barnet Glasgow, NP  rosuvastatin (CRESTOR) 20 MG tablet Take 20 mg by mouth daily.  02/18/19  [provider]    Allergies    Latex and Peanut-containing drug products  Review of Systems   Review of Systems  Constitutional:  Negative for fever.  Musculoskeletal:  Positive for arthralgias and joint swelling.  Skin:  Negative for color change, rash and wound.  Allergic/Immunologic: Negative for immunocompromised state.  Neurological:  Negative for weakness and numbness.  Hematological:  Does not bruise/bleed easily.  Psychiatric/Behavioral:  Negative for confusion.  All other systems reviewed and are negative.  Physical Exam Updated Vital Signs BP (!) 152/74   Pulse 66   Temp 99.4 F (37.4 C) (Oral)   Resp 16   SpO2 100%   Physical Exam Vitals and nursing note reviewed.  Constitutional:      General: She is not in acute distress.    Appearance: She is well-developed. She is not diaphoretic.  HENT:     Head: Normocephalic and atraumatic.  Cardiovascular:     Pulses: Normal pulses.  Pulmonary:     Effort: Pulmonary effort is normal.  Musculoskeletal:        General: Swelling and tenderness present. No deformity or signs of injury.     Comments: mild  swelling to right wrist, slightly warm to the touch, mild to moderate diffuse tenderness. Able to flex/extend fingers. Sensation intact, radial pulse present.   Skin:    General: Skin is warm and dry.     Findings: No bruising or erythema.  Neurological:     Mental Status: She is alert and oriented to person, place, and time.     Sensory: No sensory deficit.     Motor: No weakness.  Psychiatric:        Behavior: Behavior normal.    ED Results / Procedures / Treatments   Labs (all labs ordered are listed, but only abnormal results are displayed) Labs Reviewed - No data to display  EKG None  Radiology DG Wrist Complete Right  Result Date: 03/25/2021 CLINICAL DATA:  Swollen and sore. EXAM: RIGHT WRIST - COMPLETE 3+ VIEW COMPARISON:  Right hand x-ray 05/01/2016. FINDINGS: Bones are mildly osteopenic. There are findings suspicious for a small triquetral fracture with overlying soft tissue swelling. There is no evidence for dislocation. Degenerative narrowing and osteophytes are seen in the lateral carpal bones. There is degenerative narrowing of the radiocarpal joint. There is calcification of the triangular fibrocartilage, also likely degenerative. IMPRESSION: 1. Findings suspicious for acute triquetral fracture. 2. Degenerative changes of the wrist. Electronically Signed   By: Ronney Asters M.D.   On: 03/25/2021 17:12    Procedures Procedures   Medications Ordered in ED Medications - No data to display  ED Course  I have reviewed the triage vital signs and the nursing notes.  Pertinent labs & imaging results that were available during my care of the patient were reviewed by me and considered in my medical decision making (see chart for details).  Clinical Course as of 03/25/21 1755  Sat Mar 26, 6783  [id number] 75 year old female presents with complaint of right wrist pain x2 days.  On exam, has mild swelling of her right wrist with mild/moderate tenderness diffusely.  Radial pulse  present, sensation intact, no pain with range of motion of her fingers, does have pain with flexion extension of the wrist. X-ray suggesting acute triquetral fracture.  Patient is tender in this area.  Discussed with Dr. Festus Barren, ER attending who has seen the patient, recommends follow-up with hand Ortho, agreeable with volar splint and recommends ibuprofen in addition to patient's Tylenol. Given strict return to ER precautions, if she develops fever, worsening pain or swelling, return to ER for further work-up. [LM]    Clinical Course User Index [LM] Roque Lias   MDM Rules/Calculators/A&P                           Final Clinical Impression(s) / ED Diagnoses Final diagnoses:  Closed  nondisplaced fracture of triquetrum of right wrist, initial encounter    Rx / DC Orders ED Discharge Orders          Ordered    ibuprofen (ADVIL) 600 MG tablet  Every 8 hours PRN        03/25/21 1744             Tacy Learn, PA-C 03/25/21 1755    Wyvonnia Dusky, MD 03/25/21 2258

## 2021-03-25 NOTE — ED Triage Notes (Signed)
Pt woke up 2 days ago and her right wrist was sore, could hardly move it. Pt states not getting any better.500 mg tylenol this am.

## 2021-03-28 DIAGNOSIS — M25562 Pain in left knee: Secondary | ICD-10-CM | POA: Diagnosis not present

## 2021-03-28 DIAGNOSIS — M25531 Pain in right wrist: Secondary | ICD-10-CM | POA: Diagnosis not present

## 2021-04-13 DIAGNOSIS — E782 Mixed hyperlipidemia: Secondary | ICD-10-CM | POA: Diagnosis not present

## 2021-04-13 DIAGNOSIS — R634 Abnormal weight loss: Secondary | ICD-10-CM | POA: Diagnosis not present

## 2021-04-13 DIAGNOSIS — F064 Anxiety disorder due to known physiological condition: Secondary | ICD-10-CM | POA: Diagnosis not present

## 2021-04-13 DIAGNOSIS — M130 Polyarthritis, unspecified: Secondary | ICD-10-CM | POA: Diagnosis not present

## 2021-04-13 DIAGNOSIS — F4381 Prolonged grief disorder: Secondary | ICD-10-CM | POA: Diagnosis not present

## 2021-04-13 DIAGNOSIS — I10 Essential (primary) hypertension: Secondary | ICD-10-CM | POA: Diagnosis not present

## 2021-04-13 DIAGNOSIS — R7303 Prediabetes: Secondary | ICD-10-CM | POA: Diagnosis not present

## 2021-04-13 DIAGNOSIS — E1169 Type 2 diabetes mellitus with other specified complication: Secondary | ICD-10-CM | POA: Diagnosis not present

## 2021-04-13 DIAGNOSIS — E559 Vitamin D deficiency, unspecified: Secondary | ICD-10-CM | POA: Diagnosis not present

## 2021-05-22 ENCOUNTER — Other Ambulatory Visit (HOSPITAL_COMMUNITY): Payer: Self-pay | Admitting: Family Medicine

## 2021-05-22 DIAGNOSIS — Z1231 Encounter for screening mammogram for malignant neoplasm of breast: Secondary | ICD-10-CM

## 2021-05-25 DIAGNOSIS — F064 Anxiety disorder due to known physiological condition: Secondary | ICD-10-CM | POA: Diagnosis not present

## 2021-05-25 DIAGNOSIS — E1169 Type 2 diabetes mellitus with other specified complication: Secondary | ICD-10-CM | POA: Diagnosis not present

## 2021-05-25 DIAGNOSIS — M130 Polyarthritis, unspecified: Secondary | ICD-10-CM | POA: Diagnosis not present

## 2021-06-22 ENCOUNTER — Ambulatory Visit (HOSPITAL_COMMUNITY)
Admission: RE | Admit: 2021-06-22 | Discharge: 2021-06-22 | Disposition: A | Discharge status: Home / Self Care | Payer: PPO | Source: Ambulatory Visit | Attending: Family Medicine | Admitting: Family Medicine

## 2021-06-22 ENCOUNTER — Other Ambulatory Visit: Payer: Self-pay

## 2021-06-22 DIAGNOSIS — Z1231 Encounter for screening mammogram for malignant neoplasm of breast: Secondary | ICD-10-CM | POA: Diagnosis not present

## 2021-07-27 DIAGNOSIS — E785 Hyperlipidemia, unspecified: Secondary | ICD-10-CM | POA: Diagnosis not present

## 2021-07-27 DIAGNOSIS — F064 Anxiety disorder due to known physiological condition: Secondary | ICD-10-CM | POA: Diagnosis not present

## 2021-07-27 DIAGNOSIS — F4381 Prolonged grief disorder: Secondary | ICD-10-CM | POA: Diagnosis not present

## 2021-07-27 DIAGNOSIS — K219 Gastro-esophageal reflux disease without esophagitis: Secondary | ICD-10-CM | POA: Diagnosis not present

## 2021-07-27 DIAGNOSIS — R7303 Prediabetes: Secondary | ICD-10-CM | POA: Diagnosis not present

## 2021-07-28 DIAGNOSIS — F064 Anxiety disorder due to known physiological condition: Secondary | ICD-10-CM | POA: Diagnosis not present

## 2021-07-28 DIAGNOSIS — K219 Gastro-esophageal reflux disease without esophagitis: Secondary | ICD-10-CM | POA: Diagnosis not present

## 2021-07-28 DIAGNOSIS — E785 Hyperlipidemia, unspecified: Secondary | ICD-10-CM | POA: Diagnosis not present

## 2021-07-28 DIAGNOSIS — F4381 Prolonged grief disorder: Secondary | ICD-10-CM | POA: Diagnosis not present

## 2021-07-28 DIAGNOSIS — R7303 Prediabetes: Secondary | ICD-10-CM | POA: Diagnosis not present

## 2021-08-10 DIAGNOSIS — F4381 Prolonged grief disorder: Secondary | ICD-10-CM | POA: Diagnosis not present

## 2021-08-10 DIAGNOSIS — K219 Gastro-esophageal reflux disease without esophagitis: Secondary | ICD-10-CM | POA: Diagnosis not present

## 2021-08-10 DIAGNOSIS — J301 Allergic rhinitis due to pollen: Secondary | ICD-10-CM | POA: Diagnosis not present

## 2021-08-10 DIAGNOSIS — E782 Mixed hyperlipidemia: Secondary | ICD-10-CM | POA: Diagnosis not present

## 2021-08-10 DIAGNOSIS — R7303 Prediabetes: Secondary | ICD-10-CM | POA: Diagnosis not present

## 2021-11-09 DIAGNOSIS — E1169 Type 2 diabetes mellitus with other specified complication: Secondary | ICD-10-CM | POA: Diagnosis not present

## 2021-11-09 DIAGNOSIS — Z6826 Body mass index (BMI) 26.0-26.9, adult: Secondary | ICD-10-CM | POA: Diagnosis not present

## 2021-11-09 DIAGNOSIS — I10 Essential (primary) hypertension: Secondary | ICD-10-CM | POA: Diagnosis not present

## 2021-11-09 DIAGNOSIS — E782 Mixed hyperlipidemia: Secondary | ICD-10-CM | POA: Diagnosis not present

## 2021-11-09 DIAGNOSIS — G4700 Insomnia, unspecified: Secondary | ICD-10-CM | POA: Diagnosis not present

## 2021-12-13 DIAGNOSIS — F419 Anxiety disorder, unspecified: Secondary | ICD-10-CM | POA: Diagnosis not present

## 2021-12-13 DIAGNOSIS — I10 Essential (primary) hypertension: Secondary | ICD-10-CM | POA: Diagnosis not present

## 2021-12-29 DIAGNOSIS — K529 Noninfective gastroenteritis and colitis, unspecified: Secondary | ICD-10-CM | POA: Diagnosis not present

## 2021-12-29 DIAGNOSIS — K219 Gastro-esophageal reflux disease without esophagitis: Secondary | ICD-10-CM | POA: Diagnosis not present

## 2021-12-29 DIAGNOSIS — K591 Functional diarrhea: Secondary | ICD-10-CM | POA: Diagnosis not present

## 2022-01-05 DIAGNOSIS — K219 Gastro-esophageal reflux disease without esophagitis: Secondary | ICD-10-CM | POA: Diagnosis not present

## 2022-01-05 DIAGNOSIS — R1084 Generalized abdominal pain: Secondary | ICD-10-CM | POA: Diagnosis not present

## 2022-01-08 DIAGNOSIS — R1084 Generalized abdominal pain: Secondary | ICD-10-CM | POA: Diagnosis not present

## 2022-01-08 DIAGNOSIS — K219 Gastro-esophageal reflux disease without esophagitis: Secondary | ICD-10-CM | POA: Diagnosis not present

## 2022-01-09 ENCOUNTER — Other Ambulatory Visit: Payer: Self-pay | Admitting: Family Medicine

## 2022-01-09 DIAGNOSIS — R1084 Generalized abdominal pain: Secondary | ICD-10-CM

## 2022-01-18 ENCOUNTER — Encounter: Payer: Self-pay | Admitting: Gastroenterology

## 2022-01-24 ENCOUNTER — Ambulatory Visit: Payer: PPO | Admitting: Gastroenterology

## 2022-01-24 ENCOUNTER — Encounter: Payer: Self-pay | Admitting: Gastroenterology

## 2022-01-24 VITALS — BP 110/70 | HR 64 | Ht 66.0 in | Wt 162.4 lb

## 2022-01-24 DIAGNOSIS — R194 Change in bowel habit: Secondary | ICD-10-CM

## 2022-01-24 DIAGNOSIS — R634 Abnormal weight loss: Secondary | ICD-10-CM

## 2022-01-24 MED ORDER — PLENVU 140 G PO SOLR: 1.0000 | ORAL | 0 refills | Status: DC

## 2022-01-24 NOTE — Progress Notes (Signed)
Referring Provider: Lucianne Lei, MD Primary Care Physician:  Lucianne Lei, MD  Reason for Consultation:  Abdominal pain, diarrhea, bloating, belching   IMPRESSION:  Abdominal pain Change in bowel habits Bloating and belching Unintentional weight loss of 6-7 pounds Colonoscopy 1999  Symptoms developed after eating a sandwich. ? Post infectious IBS. But, with the concurrent weight loss must consider IBD, malignancy, IBD, food intolerance (lactose, fructose, sucrose), SIBO, thyroid disorder, and pancreatic insufficiency. There was no significant improvement with a course of antibiotics.    She declined empiric treatment trial, wishing to find a diagnosis first.   PLAN: CT abd/pelvis with contrast EGD and colonoscopy Obtain records from PCP re recent trial of antibiotics Consider SIBO breath test if evaluation is negative   HPI: Ashley Clements is a 76 y.o. female referred by NP Grandville Silos and Dr. Criss Rosales for further evaluation of abdominal pain and diarrhea. The history is obtained through the patient, her daughter who accompanies her to this appointment and review of her electronic health record. She has hyperlipidemia, hypertension, arthritis, anemia and a long-standing reflux treated with PPI PRN and avoiding triggering foods. She has had a hysterectomy.  Developed diffuse abdominal pain, bloating, eructation, and altered bowel habits with constipation and diarrhea 2 months ago. Symptoms started after eating a [employer] cold cuts sandwich.There is associated frequent borborygmous and frequent headaches. No sitophobia. Symptoms not effected by eating, defecation, or movement. No specific food triggers. No symptoms associated with eating mammalian meat. No nausea or vomiting.   She has lost approximately 6-8 pounds since her symptoms started. Poor appetite during this time. No blood or mucous in the stool.   Has one bowel movement daily - alternating between constipation and diarrhea.  Stools have been intermittently dark. Has recently been on iron supplements OTC because she thought her blood might be low.  Trial of pantoprazole most days has provided some relief.  Her PCP gave her a course of antibiotics when her symptoms first started.  No NSAIDs.   Labs 01/05/22: normal CMP, amylase, lipase, and CBC H pylori breath test was negative  She may have had a colonoscopy in 1999.  No abdominal imaging.  She overall feels like things are right.  There is no known family history of colon cancer or polyps. No family history of stomach cancer or other GI malignancy. No family history of inflammatory bowel disease or celiac.    Past Medical History:  Diagnosis Date   Anemia    Arthritis    Diabetes mellitus    borderline   GERD (gastroesophageal reflux disease)    Hypertension     Past Surgical History:  Procedure Laterality Date   ABDOMINAL HYSTERECTOMY     CHOLECYSTECTOMY     EYE SURGERY     Cataract left, Retina  detachment left   TOTAL KNEE ARTHROPLASTY  04/03/2012   Procedure: TOTAL KNEE ARTHROPLASTY;  Surgeon: Sharmon Revere, MD;  Location: [facility];  Service: Orthopedics;  Laterality: Right;   UTERINE FIBROID SURGERY        Current Outpatient Medications  Medication Sig Dispense Refill   citalopram (CELEXA) 10 MG tablet Take 10 mg by mouth daily.     ferrous sulfate 325 (65 FE) MG tablet Take 325 mg by mouth daily with breakfast.     fluticasone (FLONASE) 50 MCG/ACT nasal spray Place 2 sprays into both nostrils daily. 9.9 mL 2   montelukast (SINGULAIR) 10 MG tablet Take 10 mg by mouth at bedtime.  Multiple Vitamins-Minerals (MULTIVITAMINS THER. W/MINERALS) TABS tablet Take by mouth.     OVER THE COUNTER MEDICATION Digestive Advantage -1 capsule by mouth daily     pantoprazole (PROTONIX) 40 MG tablet Take 40 mg by mouth daily as needed.     PEG-KCl-NaCl-NaSulf-Na Asc-C (PLENVU) 140 g SOLR Take 1 kit by mouth as directed. Use coupon: BIN: [id number] PNC:  CNRX Group: [id number] ID: [id number] 1 each 0   raloxifene (EVISTA) 60 MG tablet Take 60 mg by mouth daily.     rosuvastatin (CRESTOR) 20 MG tablet Take 20 mg by mouth daily.     valsartan-hydrochlorothiazide (DIOVAN-HCT) 80-12.5 MG per tablet Take 1 tablet by mouth daily.     traZODone (DESYREL) 50 MG tablet Take 1 tablet by mouth daily.     No current facility-administered medications for this visit.    Allergies as of 01/24/2022 - Review Complete 01/24/2022  Allergen Reaction Noted   Latex Rash 03/24/2012   Peanut-containing drug products Rash 03/21/2012    Family History  Problem Relation Age of Onset   Heart disease Father    Diabetes Maternal Grandmother    Colon cancer Neg Hx    Esophageal cancer Neg Hx    Pancreatic cancer Neg Hx    Liver disease Neg Hx     Social History   Socioeconomic History   Marital status: Married    Spouse name: Not on file   Number of children: Not on file   Years of education: Not on file   Highest education level: Not on file  Occupational History   Not on file  Tobacco Use   Smoking status: Never   Smokeless tobacco: Never  Vaping Use   Vaping Use: Never used  Substance and Sexual Activity   Alcohol use: No   Drug use: No   Sexual activity: Not on file  Other Topics Concern   Not on file  Social History Narrative   Not on file   Social Determinants of Health   Financial Resource Strain: Not on file  Food Insecurity: Not on file  Transportation Needs: Not on file  Physical Activity: Not on file  Stress: Not on file  Social Connections: Not on file  Intimate Partner Violence: Not on file    Review of Systems: 12 system ROS is negative except as noted above.   Physical Exam: General:   Alert,  well-nourished, pleasant and cooperative in NAD Head:  Normocephalic and atraumatic. Eyes:  Sclera clear, no icterus.   Conjunctiva pink. Ears:  Normal auditory acuity. Nose:  No deformity, discharge,  or lesions. Mouth:   No deformity or lesions.   Neck:  Supple; no masses or thyromegaly. Lungs:  Clear throughout to auscultation.   No wheezes. Heart:  Regular rate and rhythm; no murmurs. Abdomen:  Soft, nontender, nondistended, normal bowel sounds, no rebound or guarding. No hepatosplenomegaly.   Rectal:  Deferred  Msk:  Symmetrical. No boney deformities LAD: No inguinal or umbilical LAD Extremities:  No clubbing or edema. Neurologic:  Alert and  oriented x4;  grossly nonfocal Skin:  Intact without significant lesions or rashes. Psych:  Alert and cooperative. Normal mood and affect.     [place] L. Tarri Glenn, MD, MPH 01/24/2022, 8:30 PM

## 2022-01-24 NOTE — Patient Instructions (Signed)
It was a pleasure to meet you today.   We discussed a CT scan and endoscopic evaluation of your change in bowel habits, weight loss, and gas.   You have been scheduled for a CT scan of the abdomen and pelvis at [facility] (1st floor of hospital).   You are scheduled on Thursday 02/01/22 at 3:30 pm. You should arrive 30 minutes prior to your appointment time for registration. Please follow the written instructions below on the day of your exam:  WARNING: IF YOU ARE ALLERGIC TO IODINE/X-RAY DYE, PLEASE NOTIFY RADIOLOGY IMMEDIATELY AT [phone]! YOU WILL BE GIVEN A 13 HOUR PREMEDICATION PREP.  1) Do not eat or drink anything after 11:30 am (4 hours prior to your test) 2) You have been given 2 bottles of oral contrast to drink. The solution may taste better if refrigerated, but do NOT add ice or any other liquid to this solution. Shake well before drinking.    Drink 1 bottle of contrast @ 1:30 pm (2 hours prior to your exam)  Drink 1 bottle of contrast @ 2:30 pm (1 hour prior to your exam)  You may take any medications as prescribed with a small amount of water, if necessary. If you take any of the following medications: METFORMIN, GLUCOPHAGE, GLUCOVANCE, AVANDAMET, RIOMET, FORTAMET, Erskine MET, JANUMET, GLUMETZA or METAGLIP, you MAY be asked to HOLD this medication 48 hours AFTER the exam.  The purpose of you drinking the oral contrast is to aid in the visualization of your intestinal tract. The contrast solution may cause some diarrhea. Depending on your individual set of symptoms, you may also receive an intravenous injection of x-ray contrast/dye. Plan on being at [facility] for 30 minutes or longer, depending on the type of exam you are having performed.  This test typically takes 30-45 minutes to complete.  If you have any questions regarding your exam or if you need to reschedule, you may call the CT department at [phone] between the hours of 8:00 am and 5:00 pm,  Monday-Friday.  ________________________________________________________   Ashley Clements have been scheduled for an endoscopy and colonoscopy. Please follow the written instructions given to you at your visit today. Please pick up your prep supplies at the pharmacy within the  next   1-3 days. If you use inhalers (even only as needed), please bring them with you on the day of your procedure.  _______________________________________________________  If you are age 15 or older, your body mass index should be between 23-30. Your Body mass index is 26.21 kg/m. If this is out of the aforementioned range listed, please consider follow up with your Primary Care Provider.  If you are age 33 or younger, your body mass index should be between 19-25. Your Body mass index is 26.21 kg/m. If this is out of the aformentioned range listed, please consider follow up with your Primary Care Provider.   ________________________________________________________  The [facility] providers would like to encourage you to use [facility] to communicate with providers for non-urgent requests or questions.  Due to long hold times on the telephone, sending your provider a message by [facility] may be a faster and more efficient way to get a response.  Please allow 48 business hours for a response.  Please remember that this is for non-urgent requests.  _______________________________________________________  Due to recent changes in healthcare laws, you may see the results of your imaging and laboratory studies on MyChart before your provider has had a chance to review them.  We understand that in some cases there may be results that are confusing or concerning to you. Not all laboratory results come back in the same time frame and the provider may be waiting for multiple results in order to interpret others.  Please give [place] 48 hours in order for your provider to thoroughly review all the results before contacting the office for clarification  of your results.

## 2022-01-29 ENCOUNTER — Telehealth: Payer: Self-pay | Admitting: Gastroenterology

## 2022-01-29 NOTE — Telephone Encounter (Signed)
Spoke with patient's daughter and advised her instructions for Miralax over the phone. Patient daughter voiced understanding.

## 2022-01-29 NOTE — Telephone Encounter (Signed)
Inbound call from patients daughter stating patient needs authorization for prep medication for upcoming procedure 01/30/22. Requesting a call back to further advise.  Thank you

## 2022-01-30 ENCOUNTER — Ambulatory Visit (AMBULATORY_SURGERY_CENTER): Payer: PPO | Admitting: Gastroenterology

## 2022-01-30 ENCOUNTER — Encounter: Payer: Self-pay | Admitting: Gastroenterology

## 2022-01-30 VITALS — BP 111/58 | HR 55 | Temp 97.3°F | Resp 14 | Ht 66.0 in | Wt 162.0 lb

## 2022-01-30 DIAGNOSIS — R634 Abnormal weight loss: Secondary | ICD-10-CM | POA: Diagnosis not present

## 2022-01-30 DIAGNOSIS — D124 Benign neoplasm of descending colon: Secondary | ICD-10-CM

## 2022-01-30 DIAGNOSIS — R194 Change in bowel habit: Secondary | ICD-10-CM | POA: Diagnosis not present

## 2022-01-30 DIAGNOSIS — D120 Benign neoplasm of cecum: Secondary | ICD-10-CM | POA: Diagnosis not present

## 2022-01-30 DIAGNOSIS — R109 Unspecified abdominal pain: Secondary | ICD-10-CM | POA: Diagnosis not present

## 2022-01-30 DIAGNOSIS — R197 Diarrhea, unspecified: Secondary | ICD-10-CM | POA: Diagnosis not present

## 2022-01-30 DIAGNOSIS — R140 Abdominal distension (gaseous): Secondary | ICD-10-CM

## 2022-01-30 DIAGNOSIS — I10 Essential (primary) hypertension: Secondary | ICD-10-CM | POA: Diagnosis not present

## 2022-01-30 DIAGNOSIS — K5730 Diverticulosis of large intestine without perforation or abscess without bleeding: Secondary | ICD-10-CM

## 2022-01-30 MED ORDER — SODIUM CHLORIDE 0.9 % IV SOLN: 500.0000 mL | Freq: Once | INTRAVENOUS | Status: DC

## 2022-01-30 NOTE — Progress Notes (Signed)
Report to PACU, RN, vss, BBS= Clear.  

## 2022-01-30 NOTE — Op Note (Signed)
[facility] Patient Name: Ashley Clements Procedure Date: 01/30/2022 3:16 PM MRN: [id number] Endoscopist: Thornton Park MD, MD Age: 76 Referring MD:  Date of Birth: 12-07-1945 Gender: Female Account #: [id number] Procedure:                Colonoscopy Indications:              Abdominal pain, Weight loss Medicines:                Monitored Anesthesia Care Procedure:                Pre-Anesthesia Assessment:                           - Prior to the procedure, a History and Physical                            was performed, and patient medications and                            allergies were reviewed. The patient's tolerance of                            previous anesthesia was also reviewed. The risks                            and benefits of the procedure and the sedation                            options and risks were discussed with the patient.                            All questions were answered, and informed consent                            was obtained. Prior Anticoagulants: The patient has                            taken no previous anticoagulant or antiplatelet                            agents. ASA Grade Assessment: III - A patient with                            severe systemic disease. After reviewing the risks                            and benefits, the patient was deemed in                            satisfactory condition to undergo the procedure.                           After obtaining informed consent, the colonoscope  was passed under direct vision. Throughout the                            procedure, the patient's blood pressure, pulse, and                            oxygen saturations were monitored continuously. The                            Olympus CF-HQ190L [id number]) Colonoscope was                            introduced through the anus and advanced to the 3                            cm into the ileum. The  colonoscopy was performed                            without difficulty. The patient tolerated the                            procedure well. The quality of the bowel                            preparation was good. The terminal ileum, ileocecal                            valve, appendiceal orifice, and rectum were                            photographed. Scope In: 3:29:04 PM Scope Out: 3:43:43 PM Scope Withdrawal Time: 0 hours 11 minutes 46 seconds  Total Procedure Duration: 0 hours 14 minutes 39 seconds  Findings:                 The perianal and digital rectal examinations were                            normal.                           Multiple small and large-mouthed diverticula were                            found in the sigmoid colon and descending colon.                           A 2 mm polyp was found in the descending colon. The                            polyp was sessile. The polyp was removed with a                            cold snare. Resection and retrieval were complete.  Estimated blood loss was minimal.                           A 1 mm polyp was found in the cecum. The polyp was                            sessile. The polyp was removed with a cold snare.                            Resection and retrieval were complete. Estimated                            blood loss was minimal.                           The exam was otherwise without abnormality on                            direct and retroflexion views. Complications:            No immediate complications. Estimated Blood Loss:     Estimated blood loss was minimal. Impression:               - Diverticulosis in the sigmoid colon and in the                            descending colon.                           - One 2 mm polyp in the descending colon, removed                            with a cold snare. Resected and retrieved.                           - One 1 mm polyp in the cecum,  removed with a cold                            snare. Resected and retrieved.                           - The examination was otherwise normal on direct                            and retroflexion views. Recommendation:           - Patient has a contact number available for                            emergencies. The signs and symptoms of potential                            delayed complications were discussed with the  patient. Return to normal activities tomorrow.                            Written discharge instructions were provided to the                            patient.                           - Continue present medications.                           - Await pathology results.                           - Repeat colonoscopy is not recommended for                            surveillance due to age.                           - Follow a high fiber diet. Drink at least 64                            ounces of water daily. Add a daily stool bulking                            agent such as psyllium (an exampled would be                            Metamucil).                           - Emerging evidence supports eating a diet of                            fruits, vegetables, grains, calcium, and yogurt                            while reducing red meat and alcohol may reduce the                            risk of colon cancer. Thornton Park MD, MD 01/30/2022 4:00:54 PM This report has been signed electronically.

## 2022-01-30 NOTE — Progress Notes (Signed)
Pt's states no medical or surgical changes since previsit or office visit. VS assessed by C.W 

## 2022-01-30 NOTE — Op Note (Signed)
[place] Patient Name: Ashley Clements Procedure Date: 01/30/2022 3:16 PM MRN: [id number] Endoscopist: Thornton Park MD, MD Age: 76 Referring MD:  Date of Birth: 12-21-1945 Gender: Female Account #: [id number] Procedure:                Upper GI endoscopy Indications:              Abdominal pain, Abdominal bloating, Weight loss Medicines:                Monitored Anesthesia Care Procedure:                Pre-Anesthesia Assessment:                           - Prior to the procedure, a History and Physical                            was performed, and patient medications and                            allergies were reviewed. The patient's tolerance of                            previous anesthesia was also reviewed. The risks                            and benefits of the procedure and the sedation                            options and risks were discussed with the patient.                            All questions were answered, and informed consent                            was obtained. Prior Anticoagulants: The patient has                            taken no previous anticoagulant or antiplatelet                            agents. ASA Grade Assessment: III - A patient with                            severe systemic disease. After reviewing the risks                            and benefits, the patient was deemed in                            satisfactory condition to undergo the procedure.                           After obtaining informed consent, the endoscope was  passed under direct vision. Throughout the                            procedure, the patient's blood pressure, pulse, and                            oxygen saturations were monitored continuously. The                            GIF [id number] was introduced through the                            mouth, and advanced to the third part of duodenum.                            The  upper GI endoscopy was accomplished without                            difficulty. The patient tolerated the procedure                            well. Scope In: Scope Out: Findings:                 The esophagus was normal.                           Patchy mildly erythematous mucosa without bleeding                            was found in the gastric body. Biopsies were taken                            from the antrum, body, and fundus with a cold                            forceps for histology. Estimated blood loss was                            minimal.                           The examined duodenum was normal except for a                            medium sized diverticulum in the second portion of                            the duodenum. Biopsies were taken with a cold                            forceps for histology. Estimated blood loss was                            minimal.  The cardia and gastric fundus were normal on                            retroflexion.                           The exam was otherwise without abnormality. Complications:            No immediate complications. Estimated Blood Loss:     Estimated blood loss was minimal. Impression:               - Normal esophagus.                           - Erythematous mucosa in the gastric body. Biopsied.                           - Duodenum diverticulum.                           - Normal examined duodenum. Biopsied.                           - The examination was otherwise normal. Recommendation:           - Patient has a contact number available for                            emergencies. The signs and symptoms of potential                            delayed complications were discussed with the                            patient. Return to normal activities tomorrow.                            Written discharge instructions were provided to the                            patient.                            - Resume previous diet.                           - Continue present medications.                           - Await pathology results.                           - No aspirin, ibuprofen, naproxen, or other                            non-steroidal anti-inflammatory drugs. Thornton Park MD, MD 01/30/2022 3:55:06 PM This report has been signed electronically.

## 2022-01-30 NOTE — Progress Notes (Signed)
Indication for upper endoscopy: Weight loss, abdominal pain, bloating Indication for colonoscopy: Weight loss, abdominal pain, change in bowel habits  Please see my 01/24/2022 office note for complete details.  There is been no significant change in history or physical exam since that time.  Patient remains an appropriate candidate for monitored anesthesia care in the endoscopy center.

## 2022-01-30 NOTE — Patient Instructions (Signed)
Thank you for coming in to see [place] today! Resume your regular diet and medications today. Return to normal daily activities tomorrow.. No future routine colonoscopies recommend due to age guidelines. Avoid ALL non-steroidal anti-inflammatories ( Ibuprofen, Aleve, prescription nsaids) Tylenol is safe to use for pain or fever.   YOU HAD AN ENDOSCOPIC PROCEDURE TODAY AT [facility]:   Refer to the procedure report that was given to you for any specific questions about what was found during the examination.  If the procedure report does not answer your questions, please call your gastroenterologist to clarify.  If you requested that your care partner not be given the details of your procedure findings, then the procedure report has been included in a sealed envelope for you to review at your convenience later.  YOU SHOULD EXPECT: Some feelings of bloating in the abdomen. Passage of more gas than usual.  Walking can help get rid of the air that was put into your GI tract during the procedure and reduce the bloating. If you had a lower endoscopy (such as a colonoscopy or flexible sigmoidoscopy) you may notice spotting of blood in your stool or on the toilet paper. If you underwent a bowel prep for your procedure, you may not have a normal bowel movement for a few days.  Please Note:  You might notice some irritation and congestion in your nose or some drainage.  This is from the oxygen used during your procedure.  There is no need for concern and it should clear up in a day or so.  SYMPTOMS TO REPORT IMMEDIATELY:  Following lower endoscopy (colonoscopy or flexible sigmoidoscopy):  Excessive amounts of blood in the stool  Significant tenderness or worsening of abdominal pains  Swelling of the abdomen that is new, acute  Fever of 100F or higher  Following upper endoscopy (EGD)  Vomiting of blood or coffee ground material  New chest pain or pain under the shoulder blades  Painful or  persistently difficult swallowing  New shortness of breath  Fever of 100F or higher  Black, tarry-looking stools  For urgent or emergent issues, a gastroenterologist can be reached at any hour by calling [phone]. Do not use MyChart messaging for urgent concerns.    DIET:  We do recommend a small meal at first, but then you may proceed to your regular diet.  Drink plenty of fluids but you should avoid alcoholic beverages for 24 hours.  ACTIVITY:  You should plan to take it easy for the rest of today and you should NOT DRIVE or use heavy machinery until tomorrow (because of the sedation medicines used during the test).    FOLLOW UP: Our staff will call the number listed on your records the next business day following your procedure.  We will call around 7:15- 8:00 am to check on you and address any questions or concerns that you may have regarding the information given to you following your procedure. If we do not reach you, we will leave a message.  If you develop any symptoms (ie: fever, flu-like symptoms, shortness of breath, cough etc.) before then, please call [phone].  If you test positive for Covid 19 in the 2 weeks post procedure, please call and report this information to [place].    If any biopsies were taken you will be contacted by phone or by letter within the next 1-3 weeks.  Please call us at [phone] if you have not heard about the biopsies in 3  weeks.    SIGNATURES/CONFIDENTIALITY: You and/or your care partner have signed paperwork which will be entered into your electronic medical record.  These signatures attest to the fact that that the information above on your After Visit Summary has been reviewed and is understood.  Full responsibility of the confidentiality of this discharge information lies with you and/or your care-partner.

## 2022-01-30 NOTE — Progress Notes (Signed)
Called to room to assist during endoscopic procedure.  Patient ID and intended procedure confirmed with present staff. Received instructions for my participation in the procedure from the performing physician.  

## 2022-01-31 ENCOUNTER — Telehealth: Payer: Self-pay | Admitting: *Deleted

## 2022-01-31 NOTE — Telephone Encounter (Signed)
  Follow up Call-     01/30/2022    2:05 PM  Call back number  Post procedure Call Back phone  # [phone]  Permission to leave phone message No     Patient questions:  Do you have a fever, pain , or abdominal swelling? No. Pain Score  0 *  Have you tolerated food without any problems? Yes.    Have you been able to return to your normal activities? Yes.    Do you have any questions about your discharge instructions: Diet   No. Medications  No. Follow up visit  No.  Do you have questions or concerns about your Care? No.  Actions: * If pain score is 4 or above: No action needed, pain <4.    Confirmed with patient that she is to proceed with scheduled CT scan with oral contrast 02/01/22 and [place] 3:30

## 2022-02-01 ENCOUNTER — Ambulatory Visit (HOSPITAL_COMMUNITY)
Admission: RE | Admit: 2022-02-01 | Discharge: 2022-02-01 | Disposition: A | Discharge status: Home / Self Care | Payer: PPO | Source: Ambulatory Visit | Attending: Gastroenterology | Admitting: Gastroenterology

## 2022-02-01 DIAGNOSIS — K5730 Diverticulosis of large intestine without perforation or abscess without bleeding: Secondary | ICD-10-CM | POA: Diagnosis not present

## 2022-02-01 DIAGNOSIS — R197 Diarrhea, unspecified: Secondary | ICD-10-CM | POA: Diagnosis not present

## 2022-02-01 DIAGNOSIS — R194 Change in bowel habit: Secondary | ICD-10-CM | POA: Diagnosis not present

## 2022-02-01 DIAGNOSIS — K7689 Other specified diseases of liver: Secondary | ICD-10-CM | POA: Diagnosis not present

## 2022-02-01 DIAGNOSIS — R634 Abnormal weight loss: Secondary | ICD-10-CM | POA: Insufficient documentation

## 2022-02-01 MED ORDER — SODIUM CHLORIDE (PF) 0.9 % IJ SOLN
INTRAMUSCULAR | Status: AC
  Filled 2022-02-01: qty 50

## 2022-02-01 MED ORDER — IOHEXOL 300 MG/ML  SOLN
100.0000 mL | Freq: Once | INTRAMUSCULAR | Status: AC | PRN
  Administered 2022-02-01: 100 mL via INTRAVENOUS

## 2022-02-09 ENCOUNTER — Other Ambulatory Visit: Payer: Self-pay

## 2022-02-13 ENCOUNTER — Telehealth: Payer: Self-pay | Admitting: Gastroenterology

## 2022-02-13 ENCOUNTER — Other Ambulatory Visit: Payer: Self-pay

## 2022-02-13 DIAGNOSIS — R9389 Abnormal findings on diagnostic imaging of other specified body structures: Secondary | ICD-10-CM

## 2022-02-13 NOTE — Telephone Encounter (Signed)
Refer to result CT result note.

## 2022-02-13 NOTE — Telephone Encounter (Signed)
Patient's daughter is returning your call 

## 2022-02-14 ENCOUNTER — Encounter: Payer: Self-pay | Admitting: Gastroenterology

## 2022-02-15 ENCOUNTER — Other Ambulatory Visit: Payer: Self-pay | Admitting: Family Medicine

## 2022-02-15 DIAGNOSIS — E279 Disorder of adrenal gland, unspecified: Secondary | ICD-10-CM

## 2022-02-16 ENCOUNTER — Telehealth: Payer: Self-pay

## 2022-02-16 NOTE — Telephone Encounter (Signed)
-----   Message from April H Pait sent at 02/16/2022  9:32 AM EDT -----  ----- Message ----- From: Cathlean Cower Sent: 02/16/2022   9:22 AM EDT To: April H Pait  02/16/22 - patient will call back to r/s appt.   ----- Message ----- From: Augustine Radar Sent: 02/13/2022  12:37 PM EDT To: Cathlean Cower   ----- Message ----- From: Carl Best, RN Sent: 02/13/2022   9:36 AM EDT To: April H Pait; Roosvelt Maser  Patient needs to be scheduled for MRI. Orders are in. Thank you!

## 2022-02-21 ENCOUNTER — Ambulatory Visit (HOSPITAL_COMMUNITY)
Admission: RE | Admit: 2022-02-21 | Discharge: 2022-02-21 | Disposition: A | Discharge status: Home / Self Care | Payer: PPO | Source: Ambulatory Visit | Attending: Gastroenterology | Admitting: Gastroenterology

## 2022-02-21 ENCOUNTER — Other Ambulatory Visit: Payer: Self-pay | Admitting: Gastroenterology

## 2022-02-21 DIAGNOSIS — K760 Fatty (change of) liver, not elsewhere classified: Secondary | ICD-10-CM | POA: Diagnosis not present

## 2022-02-21 DIAGNOSIS — R9389 Abnormal findings on diagnostic imaging of other specified body structures: Secondary | ICD-10-CM | POA: Diagnosis not present

## 2022-02-21 MED ORDER — GADOBUTROL 1 MMOL/ML IV SOLN
6.0000 mL | Freq: Once | INTRAVENOUS | Status: AC | PRN
  Administered 2022-02-21: 6 mL via INTRAVENOUS

## 2022-02-27 ENCOUNTER — Ambulatory Visit: Payer: PPO | Admitting: Gastroenterology

## 2022-03-06 ENCOUNTER — Other Ambulatory Visit: Payer: Self-pay

## 2022-03-06 ENCOUNTER — Telehealth: Payer: Self-pay | Admitting: Gastroenterology

## 2022-03-06 MED ORDER — PANTOPRAZOLE SODIUM 40 MG PO TBEC: 40.0000 mg | DELAYED_RELEASE_TABLET | Freq: Two times a day (BID) | ORAL | 3 refills | Status: DC

## 2022-03-06 NOTE — Telephone Encounter (Signed)
Pt reports she is having issues with reflux/epigastric pain. Reports the protonix is not helping. Requesting a different medication for her reflux. Please advise.

## 2022-03-06 NOTE — Telephone Encounter (Signed)
Pt reports that she is taking the protonix '40mg'$  30 min before she eats breakfast.

## 2022-03-06 NOTE — Telephone Encounter (Signed)
Inbound call from patient staying that she is having issues with acid reflux and is requesting a call back to discuss sometime this morning. Please advise.

## 2022-03-06 NOTE — Telephone Encounter (Signed)
Spoke with pt and she is aware of Dr. Tarri Glenn recommendations. New prescription with BID dosing sent to pharmacy.

## 2022-04-17 ENCOUNTER — Encounter: Payer: Self-pay | Admitting: Gastroenterology

## 2022-04-17 ENCOUNTER — Ambulatory Visit: Payer: PPO | Admitting: Gastroenterology

## 2022-04-17 ENCOUNTER — Other Ambulatory Visit: Payer: PPO

## 2022-04-17 VITALS — BP 130/76 | HR 65 | Ht 66.0 in | Wt 160.6 lb

## 2022-04-17 DIAGNOSIS — R140 Abdominal distension (gaseous): Secondary | ICD-10-CM

## 2022-04-17 DIAGNOSIS — R634 Abnormal weight loss: Secondary | ICD-10-CM

## 2022-04-17 DIAGNOSIS — R194 Change in bowel habit: Secondary | ICD-10-CM | POA: Diagnosis not present

## 2022-04-17 DIAGNOSIS — R9389 Abnormal findings on diagnostic imaging of other specified body structures: Secondary | ICD-10-CM | POA: Diagnosis not present

## 2022-04-17 NOTE — Patient Instructions (Addendum)
We discussed a stool test to check the pancreas.  We will plan an MRI in February to follow-up on the pancreas.  I have recommended a breath test to look for bacterial overgrowth. You are at risk given your history of small bowel diverticulosis.   The CT and MRI showed a benign adrenal adenoma.   You have been given a testing kit to check for small intestine bacterial overgrowth (SIBO) which is completed by a company named [facility]. Make sure to return your test in the mail using the return mailing label given to you along with the kit. Your demographic and insurance information have already been sent to the company and they should be in contact with you over the next 1-2 weeks regarding this test. [facility] will collect an upfront charge of $99.74 for commercial insurance plans and $209.74 is you are paying cash. Make sure to discuss with [facility] PRIOR to having the test to see if they have gotten information from your insurance company as to how much your testing will cost out of pocket, if any. Please keep in mind that you will be getting a call from phone number [phone] or a similar number. If you do not hear from them within this time frame, please call our office at [phone] or call [facility] directly at [phone].   

## 2022-04-17 NOTE — Progress Notes (Unsigned)
Referring Provider: Lucianne Lei, MD Primary Care Physician:  Lucianne Lei, MD  Reason for Consultation:  Abdominal pain, diarrhea, bloating, belching   IMPRESSION:  Abdominal pain Change in bowel habits Bloating and belching Duodenal diverticulum  Adrenal adenoma - not associated by cancer Unintentional weight loss of 6-7 pounds Colonoscopy 1999  Symptoms developed after eating a sandwich. ? Post infectious IBS. But, with the concurrent weight loss must consider IBD, malignancy, IBD, food intolerance (lactose, fructose, sucrose), SIBO, thyroid disorder, and pancreatic insufficiency. There was no significant improvement with a course of antibiotics.    She declined empiric treatment trial, wishing to find a diagnosis first.   PLAN: Pancreatic elastase  MRI February 2024 to follow-up on the abnormal pancreas SIBO breath test with antibiotics if appropriate   HPI: Ashley Clements is a 76 y.o. female referred by NP Grandville Silos and Dr. Criss Rosales for further evaluation of abdominal pain and diarrhea. The history is obtained through the patient, her daughter who accompanies her to this appointment and review of her electronic health record. She has hyperlipidemia, hypertension, arthritis, anemia and a long-standing reflux treated with PPI PRN and avoiding triggering foods. She has had a hysterectomy.  Developed diffuse abdominal pain, bloating, eructation, and altered bowel habits with constipation and diarrhea 2 months ago. Symptoms started after eating a [employer] cold cuts sandwich.There is associated frequent borborygmous and frequent headaches. No sitophobia. Symptoms not effected by eating, defecation, or movement. No specific food triggers. No symptoms associated with eating mammalian meat. No nausea or vomiting.   She has lost approximately 6-8 pounds since her symptoms started. Poor appetite during this time. No blood or mucous in the stool.   Has one bowel movement daily -  alternating between constipation and diarrhea. Stools have been intermittently dark. Has recently been on iron supplements OTC because she thought her blood might be low.  Trial of pantoprazole most days has provided some relief.  Her PCP gave her a course of antibiotics when her symptoms first started.  No NSAIDs.   Labs 01/05/22: normal CMP, amylase, lipase, and CBC H pylori breath test was negative  She may have had a colonoscopy in 1999.  No abdominal imaging.  She overall feels like things are right.  Colonoscopy 01/30/2022 showed left-sided diverticulosis, a 2 mm descending colon polyp, and a 1 mm cecal polyp.  1 polyp was a tubular adenoma the other was a lymphoid aggregate.  EGD 01/30/2022 showed gastritis and a duodenal diverticulum in the second portion of the duodenum.  Gastric and duodenal biopsies were normal.  CT of the abdomen and pelvis with contrast 02/01/2022 showed a 2.1 cm left adrenal nodule.  There is a 2.1 cm fluid density structure in the anterior aspect of the head of the pancreas.  There is a 1.5 cm hypervascular structure in the liver.  Follow-up MRI recommended.  MRI 02/21/2022 showed a large macrocystic lesion measuring 3.0 x 1.6 x 1.2 cm with no high risk imaging features.  Repeat imaging recommended in 6 months.  Other findings include hepatic steatosis, hepatic hemangiomas, and a small right pleural effusion.  She felt better for one month after the procedure. She has developed some diarrhea.   She was referred to Dr. Fransico Setters office.  There is no known family history of colon cancer or polyps. No family history of stomach cancer or other GI malignancy. No family history of inflammatory bowel disease or celiac.    Past Medical History:  Diagnosis Date   Anemia  Arthritis    Diabetes mellitus    borderline   GERD (gastroesophageal reflux disease)    Hypertension     Past Surgical History:  Procedure Laterality Date   ABDOMINAL HYSTERECTOMY      CHOLECYSTECTOMY     EYE SURGERY     Cataract left, Retina  detachment left   TOTAL KNEE ARTHROPLASTY  04/03/2012   Procedure: TOTAL KNEE ARTHROPLASTY;  Surgeon: Sharmon Revere, MD;  Location: [facility];  Service: Orthopedics;  Laterality: Right;   UTERINE FIBROID SURGERY        Current Outpatient Medications  Medication Sig Dispense Refill   citalopram (CELEXA) 10 MG tablet Take 10 mg by mouth daily.     ferrous sulfate 325 (65 FE) MG tablet Take 325 mg by mouth daily with breakfast.     montelukast (SINGULAIR) 10 MG tablet Take 10 mg by mouth at bedtime.     Multiple Vitamins-Minerals (MULTIVITAMINS THER. W/MINERALS) TABS tablet Take by mouth.     pantoprazole (PROTONIX) 40 MG tablet Take 40 mg by mouth daily as needed.     raloxifene (EVISTA) 60 MG tablet Take 60 mg by mouth daily.     rosuvastatin (CRESTOR) 20 MG tablet Take 20 mg by mouth daily.     traZODone (DESYREL) 50 MG tablet Take 1 tablet by mouth daily.     fluticasone (FLONASE) 50 MCG/ACT nasal spray Place 2 sprays into both nostrils daily. (Patient not taking: Reported on 01/30/2022) 9.9 mL 2   OVER THE COUNTER MEDICATION Digestive Advantage -1 capsule by mouth daily (Patient not taking: Reported on 01/30/2022)     pantoprazole (PROTONIX) 40 MG tablet Take 1 tablet (40 mg total) by mouth 2 (two) times daily before a meal. (Patient not taking: Reported on 04/17/2022) 60 tablet 3   valsartan-hydrochlorothiazide (DIOVAN-HCT) 80-12.5 MG per tablet Take 1 tablet by mouth daily. (Patient not taking: Reported on 04/17/2022)     No current facility-administered medications for this visit.    Allergies as of 04/17/2022 - Review Complete 04/17/2022  Allergen Reaction Noted   Latex Rash 03/24/2012   Peanut-containing drug products Rash 03/21/2012    Family History  Problem Relation Age of Onset   Heart disease Father    Diabetes Maternal Grandmother    Colon cancer Neg Hx    Esophageal cancer Neg Hx    Pancreatic cancer Neg Hx     Liver disease Neg Hx        Physical Exam: General:   Alert,  well-nourished, pleasant and cooperative in NAD Head:  Normocephalic and atraumatic. Eyes:  Sclera clear, no icterus.   Conjunctiva pink. Ears:  Normal auditory acuity. Nose:  No deformity, discharge,  or lesions. Mouth:  No deformity or lesions.   Neck:  Supple; no masses or thyromegaly. Lungs:  Clear throughout to auscultation.   No wheezes. Heart:  Regular rate and rhythm; no murmurs. Abdomen:  Soft, nontender, nondistended, normal bowel sounds, no rebound or guarding. No hepatosplenomegaly.   Rectal:  Deferred  Msk:  Symmetrical. No boney deformities LAD: No inguinal or umbilical LAD Extremities:  No clubbing or edema. Neurologic:  Alert and  oriented x4;  grossly nonfocal Skin:  Intact without significant lesions or rashes. Psych:  Alert and cooperative. Normal mood and affect.     Terrill Alperin L. Tarri Glenn, MD, MPH 04/17/2022, 3:29 PM

## 2022-04-18 ENCOUNTER — Encounter: Payer: Self-pay | Admitting: Gastroenterology

## 2022-04-19 ENCOUNTER — Other Ambulatory Visit: Payer: PPO

## 2022-04-19 DIAGNOSIS — R140 Abdominal distension (gaseous): Secondary | ICD-10-CM

## 2022-04-19 DIAGNOSIS — R634 Abnormal weight loss: Secondary | ICD-10-CM

## 2022-04-19 DIAGNOSIS — R194 Change in bowel habit: Secondary | ICD-10-CM

## 2022-04-19 DIAGNOSIS — R9389 Abnormal findings on diagnostic imaging of other specified body structures: Secondary | ICD-10-CM | POA: Diagnosis not present

## 2022-04-26 DIAGNOSIS — R140 Abdominal distension (gaseous): Secondary | ICD-10-CM | POA: Diagnosis not present

## 2022-04-27 LAB — PANCREATIC ELASTASE, FECAL: Pancreatic Elastase-1, Stool: >500 mcg/g

## 2022-05-02 ENCOUNTER — Telehealth: Payer: Self-pay | Admitting: Gastroenterology

## 2022-05-02 NOTE — Telephone Encounter (Signed)
Inbound call from [facility] requesting order for patients breath test. Please advise.  Fax number:  [phone]

## 2022-05-03 NOTE — Telephone Encounter (Signed)
Inbound call back from [facility].Please advise.

## 2022-05-03 NOTE — Telephone Encounter (Signed)
Faxed order to [facility].

## 2022-05-08 ENCOUNTER — Telehealth: Payer: Self-pay

## 2022-05-08 NOTE — Telephone Encounter (Signed)
I will look for those results. Thanks.  ? ?

## 2022-05-08 NOTE — Telephone Encounter (Signed)
Received call from Dominica Severin with [facility] in regards to patient's SIBO report that was faxed to our office. He wanted Dr. Tarri Glenn to be aware that the results show not suspected, however there are 2 footnotes included he would like for MD to read & she has any questions then to call him directly at [phone].

## 2022-05-11 ENCOUNTER — Other Ambulatory Visit: Payer: Self-pay

## 2022-05-11 MED ORDER — RIFAXIMIN 550 MG PO TABS: 550.0000 mg | ORAL_TABLET | Freq: Three times a day (TID) | ORAL | 0 refills | Status: DC

## 2022-05-11 NOTE — Telephone Encounter (Signed)
Spoke with both patient & daughter regarding results and recommendations. Prescription sent to pharmacy, they are aware to call back if medication is too expensive so that an alternative can be sent in. OV scheduled for 06/13/22 at 11:00 am with [place], [place].

## 2022-05-14 ENCOUNTER — Other Ambulatory Visit: Payer: Self-pay

## 2022-05-14 MED ORDER — DOXYCYCLINE HYCLATE 100 MG PO CAPS: 100.0000 mg | ORAL_CAPSULE | Freq: Two times a day (BID) | ORAL | 0 refills | Status: AC

## 2022-05-14 NOTE — Telephone Encounter (Addendum)
Patients daughter calling to follow up on previous message states the medication is too expensive seeking further advise.

## 2022-05-14 NOTE — Telephone Encounter (Signed)
Spoke with patient's daughter & she's aware alternative has been sent to pharmacy.

## 2022-05-16 ENCOUNTER — Other Ambulatory Visit (HOSPITAL_COMMUNITY): Payer: Self-pay

## 2022-05-18 ENCOUNTER — Other Ambulatory Visit (HOSPITAL_COMMUNITY): Payer: Self-pay | Admitting: Family Medicine

## 2022-05-18 DIAGNOSIS — Z1231 Encounter for screening mammogram for malignant neoplasm of breast: Secondary | ICD-10-CM

## 2022-06-11 NOTE — Progress Notes (Unsigned)
06/13/2022 Ashley Clements [id number] 1945/10/18  Referring provider: Lucianne Lei, MD Primary GI doctor: Dr. Tarri Glenn  ASSESSMENT AND PLAN:   Change in bowel habits, bloating, weight loss of 8 pounds over a year.  Some early satiety. Negative pancreatic elastase Colon and EGD unremarkable other than gastritis, patient doing well on PPI. Borderline positive SIBO, treated doxycycline, some response and bowel habits not helping bloating. Patient already is diagnosed with lactose intolerance. Will see if we can try to get enough samples of Xifaxan in case patient has relapsed Cipro and try and do trial next office visit. Given FODMAP diet since patient also does not tolerate lactulose well and encouraged to do food diary. Patient is prediabetic, no high risk factors for gastroparesis, but some the symptoms may sound like this, given gastroparesis diet, consider GES. FDgard given.  Abnormal CT scan 02/01/2022 abnormal CT with pancreatic cyst  02/21/2022 MRI no high risk imaging features of pancreatic head cyst, recall 6 months. Sure patient has recall February 2024 for repeat MRI abdomen with and without.  Benign neoplasm of cecum 01/30/2022 colonoscopy showed left-sided diverticulosis, 2 mm descending colon polyp, 1 mm cecal polyp, 1 polyp tubular adenoma the other 1 lymphoid aggregate  Will plan for follow-up after MRI abdomen. Will call sooner if any worsening symptoms or any new symptoms.  Patient Care Team: Lucianne Lei, MD as PCP - General (Family Medicine)  HISTORY OF PRESENT ILLNESS: 76 y.o. female with a past medical history of hyperlipidemia, hypertension, arthritis, anemia, reflux, status post hysterectomy and others listed below presents for evaluation of abdominal pain and diarrhea status post SIBO treatment.  01/30/2022 colonoscopy showed left-sided diverticulosis, 2 mm descending colon polyp, 1 mm cecal polyp, 1 polyp tubular adenoma the other 1 lymphoid  aggregate 01/30/2022 EGD showed gastritis and duodenal diverticulum second portion of duodenum, unremarkable biopsies. 02/01/2022 CT abdomen pelvis with contrast 2.1 cm left adrenal adenoma, 2.1 cm fluid density structure anterior aspect of the head of the pancreas 1.5 cm hypervascular structure in the liver. 02/21/2022 MRI for abnormality seen on CT scan showed large macrocystic lesion measuring 3 x 1.6 x 1.2 cm no high risk imaging features recommend imaging follow-up 6 months.  Also include hepatic steatosis, hepatic hemangiomas and small right pleural effusion. Patient had unremarkable pancreatic elastase, needs MRI follow-up February 2024 for abnormal pancreas/liver, SIBO test was negative however per Dr. Tarri Glenn elevated and sustained hydrogen level it may support SIBO so did trial of Xifaxan with doxycycline 100 mg twice daily 14 days if too expensive.  She did not have help from the doxcycline, in fact she felt worse but has had more formed predictable stools. She continues to have bloating.  She is having BM 1-2 x a day, feels complete BM.  She is having bloating still.  She is still having some AB discomfort with certain foods in epigastric area.  She has some early satiety.   Wt Readings from Last 3 Encounters:  06/13/22 162 lb (73.5 kg)  04/17/22 160 lb 9.6 oz (72.8 kg)  01/30/22 162 lb (73.5 kg)      She  reports that she has never smoked. She has never used smokeless tobacco. She reports that she does not drink alcohol and does not use drugs.  Current Medications:   Current Outpatient Medications (Endocrine & Metabolic):    raloxifene (EVISTA) 60 MG tablet, Take 60 mg by mouth daily.  Current Outpatient Medications (Cardiovascular):    rosuvastatin (CRESTOR) 20 MG tablet, Take  20 mg by mouth daily.   valsartan-hydrochlorothiazide (DIOVAN-HCT) 80-12.5 MG per tablet, Take 1 tablet by mouth daily.  Current Outpatient Medications (Respiratory):    fluticasone (FLONASE) 50  MCG/ACT nasal spray, Place 2 sprays into both nostrils daily.   montelukast (SINGULAIR) 10 MG tablet, Take 10 mg by mouth at bedtime.   Current Outpatient Medications (Hematological):    ferrous sulfate 325 (65 FE) MG tablet, Take 325 mg by mouth daily with breakfast.  Current Outpatient Medications (Other):    citalopram (CELEXA) 10 MG tablet, Take 10 mg by mouth daily.   Multiple Vitamins-Minerals (MULTIVITAMINS THER. W/MINERALS) TABS tablet, Take by mouth.   OVER THE COUNTER MEDICATION, Digestive Advantage -1 capsule by mouth daily   pantoprazole (PROTONIX) 40 MG tablet, Take 40 mg by mouth daily as needed.   pantoprazole (PROTONIX) 40 MG tablet, Take 1 tablet (40 mg total) by mouth 2 (two) times daily before a meal.   traZODone (DESYREL) 50 MG tablet, Take 1 tablet by mouth daily.  Medical History:  Past Medical History:  Diagnosis Date   Anemia    Arthritis    Diabetes mellitus    borderline   GERD (gastroesophageal reflux disease)    Hypertension    Allergies:  Allergies  Allergen Reactions   Latex Rash   Peanut-Containing Drug Products Rash    All nuts     Surgical History:  She  has a past surgical history that includes Cholecystectomy; Abdominal hysterectomy; Eye surgery; Total knee arthroplasty (04/03/2012); and Uterine fibroid surgery. Family History:  Her family history includes Diabetes in her maternal grandmother; Heart disease in her father.  REVIEW OF SYSTEMS  : All other systems reviewed and negative except where noted in the History of Present Illness.  PHYSICAL EXAM: BP 138/76   Pulse 97   Ht '5\' 6"'$  (1.676 m)   Wt 162 lb (73.5 kg)   BMI 26.15 kg/m  General:   Pleasant, well developed female in no acute distress Head:   Normocephalic and atraumatic. Eyes:  sclerae anicteric,conjunctive pink  Heart:   regular rate and rhythm Pulm:  Clear anteriorly; no wheezing Abdomen:   Soft, Obese AB, Active bowel sounds. No tenderness . Without guarding and  Without rebound, No organomegaly appreciated. Rectal: Not evaluated Extremities:  Without edema. Msk: Symmetrical without gross deformities. Peripheral pulses intact.  Neurologic:  Alert and  oriented x4;  No focal deficits.  Skin:   Dry and intact without significant lesions or rashes. Psychiatric:  Cooperative. Normal mood and affect.  RELEVANT LABS AND IMAGING: CBC    Component Value Date/Time   WBC 5.3 04/24/2018 0901   RBC 3.96 04/24/2018 0901   HGB 10.7 (L) 04/24/2018 0901   HCT 34.8 (L) 04/24/2018 0901   PLT 218 04/24/2018 0901   MCV 87.9 04/24/2018 0901   MCH 27.0 04/24/2018 0901   MCHC 30.7 04/24/2018 0901   RDW 14.3 04/24/2018 0901   LYMPHSABS 1.8 04/24/2018 0901   MONOABS 0.3 04/24/2018 0901   EOSABS 0.2 04/24/2018 0901   BASOSABS 0.0 04/24/2018 0901    CMP     Component Value Date/Time   NA 140 04/24/2018 0901   K 3.6 04/24/2018 0901   CL 110 04/24/2018 0901   CO2 24 04/24/2018 0901   GLUCOSE 115 (H) 04/24/2018 0901   BUN 12 04/24/2018 0901   CREATININE 0.61 04/24/2018 0901   CALCIUM 8.6 (L) 04/24/2018 0901   PROT 6.2 (L) 04/24/2018 0901   ALBUMIN 3.3 (L) 04/24/2018 0901  AST 21 04/24/2018 0901   ALT 14 04/24/2018 0901   ALKPHOS 37 (L) 04/24/2018 0901   BILITOT 0.4 04/24/2018 0901   GFRNONAA >60 04/24/2018 0901   GFRAA >60 04/24/2018 0901     Vladimir Crofts, PA-C 11:28 AM

## 2022-06-13 ENCOUNTER — Encounter: Payer: Self-pay | Admitting: Physician Assistant

## 2022-06-13 ENCOUNTER — Ambulatory Visit (INDEPENDENT_AMBULATORY_CARE_PROVIDER_SITE_OTHER): Payer: PPO | Admitting: Physician Assistant

## 2022-06-13 VITALS — BP 138/76 | HR 97 | Ht 66.0 in | Wt 162.0 lb

## 2022-06-13 DIAGNOSIS — D120 Benign neoplasm of cecum: Secondary | ICD-10-CM | POA: Diagnosis not present

## 2022-06-13 DIAGNOSIS — R634 Abnormal weight loss: Secondary | ICD-10-CM | POA: Diagnosis not present

## 2022-06-13 DIAGNOSIS — R194 Change in bowel habit: Secondary | ICD-10-CM | POA: Diagnosis not present

## 2022-06-13 DIAGNOSIS — R9389 Abnormal findings on diagnostic imaging of other specified body structures: Secondary | ICD-10-CM

## 2022-06-13 DIAGNOSIS — R140 Abdominal distension (gaseous): Secondary | ICD-10-CM | POA: Diagnosis not present

## 2022-06-13 NOTE — Patient Instructions (Signed)
Please keep a food diary, the guide below can help with information.   Increase activity Can do trial of FDGard which is over the counter for AB pain- Take 1-2 capsules once a day for maintence or  Please try low FODMAP diet- see below- start with eliminating just one column at a time, the table at the very bottom contains foods that are safe to take   FODMAP stands for fermentable oligo-, di-, mono-saccharides and polyols (1). These are the scientific terms used to classify groups of carbs that are notorious for triggering digestive symptoms like bloating, gas and stomach pain.      You have been scheduled for a follow up appointment with Dr. Tarri Glenn on Tuesday, 08-21-22 at 10:10am. Please arrive 10 minutes early for registration.   You will be due for an MRI in February. You will be contacted to schedule an appointment closer to that time.   Gastroparesis Please do small frequent meals like 4-6 meals a day.  Eat and drink liquids at separate times.  Avoid high fiber foods, cook your vegetables, avoid high fat food.  Suggest spreading protein throughout the day (greek yogurt, glucerna, soft meat, milk, eggs) Choose soft foods that you can mash with a fork When you are more symptomatic, change to pureed foods foods and liquids.  Consider reading "Living well with Gastroparesis" by Lambert Keto Gastroparesis is a condition in which food takes longer than normal to empty from the stomach. This condition is also known as delayed gastric emptying. It is usually a long-term (chronic) condition. There is no cure, but there are treatments and things that you can do at home to help relieve symptoms. Treating the underlying condition that causes gastroparesis can also help relieve symptoms What are the causes? In many cases, the cause of this condition is not known. Possible causes include: A hormone (endocrine) disorder, such as hypothyroidism or diabetes. A nervous system disease, such  as Parkinson's disease or multiple sclerosis. Cancer, infection, or surgery that affects the stomach or vagus nerve. The vagus nerve runs from your chest, through your neck, and to the lower part of your brain. A connective tissue disorder, such as scleroderma. Certain medicines. What increases the risk? You are more likely to develop this condition if: You have certain disorders or diseases. These may include: An endocrine disorder. An eating disorder. Amyloidosis. Scleroderma. Parkinson's disease. Multiple sclerosis. Cancer or infection of the stomach or the vagus nerve. You have had surgery on your stomach or vagus nerve. You take certain medicines. You are female. What are the signs or symptoms? Symptoms of this condition include: Feeling full after eating very little or a loss of appetite. Nausea, vomiting, or heartburn. Bloating of your abdomen. Inconsistent blood sugar (glucose) levels on blood tests. Unexplained weight loss. Acid from the stomach coming up into the esophagus (gastroesophageal reflux). Sudden tightening (spasm) of the stomach, which can be painful. Symptoms may come and go. Some people may not notice any symptoms. How is this diagnosed? This condition is diagnosed with tests, such as: Tests that check how long it takes food to move through the stomach and intestines. These tests include: Upper gastrointestinal (GI) series. For this test, you drink a liquid that shows up well on X-rays, and then X-rays are taken of your intestines. Gastric emptying scintigraphy. For this test, you eat food that contains a small amount of radioactive material, and then scans are taken. Wireless capsule GI monitoring system. For this test, you swallow a pill (  capsule) that records information about how foods and fluid move through your stomach. Gastric manometry. For this test, a tube is passed down your throat and into your stomach to measure electrical and muscular  activity. Endoscopy. For this test, a long, thin tube with a camera and light on the end is passed down your throat and into your stomach to check for problems in your stomach lining. Ultrasound. This test uses sound waves to create images of the inside of your body. This can help rule out gallbladder disease or pancreatitis as a cause of your symptoms. How is this treated? There is no cure for this condition, but treatment and home care may relieve symptoms. Treatment may include: Treating the underlying cause. Managing your symptoms by making changes to your diet and exercise habits. Taking medicines to control nausea and vomiting and to stimulate stomach muscles. Getting food through a feeding tube in the hospital. This may be done in severe cases. Having surgery to insert a device called a gastric electrical stimulator into your body. This device helps improve stomach emptying and control nausea and vomiting. Follow these instructions at home: Take over-the-counter and prescription medicines only as told by your health care provider. Follow instructions from your health care provider about eating or drinking restrictions. Your health care provider may recommend that you: Eat smaller meals more often. Eat low-fat foods. Eat low-fiber forms of high-fiber foods. For example, eat cooked vegetables instead of raw vegetables. Have only liquid foods instead of solid foods. Liquid foods are easier to digest. Drink enough fluid to keep your urine pale yellow. Exercise as often as told by your health care provider. Keep all follow-up visits. This is important. Contact a health care provider if you: Notice that your symptoms do not improve with treatment. Have new symptoms. Get help right away if you: Have severe pain in your abdomen that does not improve with treatment. Have nausea that is severe or does not go away. Vomit every time you drink fluids. Summary Gastroparesis is a long-term  (chronic) condition in which food takes longer than normal to empty from the stomach. Symptoms include nausea, vomiting, heartburn, bloating of your abdomen, and loss of appetite. Eating smaller portions, low-fat foods, and low-fiber forms of high-fiber foods may help you manage your symptoms. Get help right away if you have severe pain in your abdomen. This information is not intended to replace advice given to you by your health care provider. Make sure you discuss any questions you have with your health care provider. Document Revised: 10/19/2019 Document Reviewed: 10/19/2019 Elsevier Patient Education  2021 [place] you for entrusting me with your care and for choosing [facility], Vicie Mutters, P.A.-C

## 2022-06-27 ENCOUNTER — Ambulatory Visit (HOSPITAL_COMMUNITY)
Admission: RE | Admit: 2022-06-27 | Discharge: 2022-06-27 | Disposition: A | Discharge status: Home / Self Care | Payer: Medicare Other | Source: Ambulatory Visit | Attending: Family Medicine | Admitting: Family Medicine

## 2022-06-27 DIAGNOSIS — Z1231 Encounter for screening mammogram for malignant neoplasm of breast: Secondary | ICD-10-CM | POA: Insufficient documentation

## 2022-06-28 NOTE — Progress Notes (Signed)
Reviewed and agree with management plans. ? ?Mohini Heathcock L. Timmy Cleverly, MD, MPH  ?

## 2022-07-09 ENCOUNTER — Other Ambulatory Visit (HOSPITAL_COMMUNITY): Payer: Self-pay | Admitting: Family Medicine

## 2022-07-09 DIAGNOSIS — R928 Other abnormal and inconclusive findings on diagnostic imaging of breast: Secondary | ICD-10-CM

## 2022-07-17 ENCOUNTER — Ambulatory Visit (HOSPITAL_COMMUNITY)
Admission: RE | Admit: 2022-07-17 | Discharge: 2022-07-17 | Disposition: A | Discharge status: Home / Self Care | Payer: Medicare Other | Source: Ambulatory Visit | Attending: Family Medicine | Admitting: Family Medicine

## 2022-07-17 ENCOUNTER — Encounter (HOSPITAL_COMMUNITY): Payer: Self-pay

## 2022-07-17 DIAGNOSIS — R928 Other abnormal and inconclusive findings on diagnostic imaging of breast: Secondary | ICD-10-CM

## 2022-07-17 DIAGNOSIS — R922 Inconclusive mammogram: Secondary | ICD-10-CM | POA: Diagnosis not present

## 2022-08-01 ENCOUNTER — Other Ambulatory Visit: Payer: Self-pay

## 2022-08-01 ENCOUNTER — Telehealth: Payer: Self-pay | Admitting: Gastroenterology

## 2022-08-01 DIAGNOSIS — K862 Cyst of pancreas: Secondary | ICD-10-CM

## 2022-08-01 NOTE — Telephone Encounter (Signed)
Order in epic, pt knows she will be contacted by rad scheduling to set up the MRI.

## 2022-08-01 NOTE — Telephone Encounter (Signed)
Inbound call from patient daughter , states that she is waiting for a nurse to call her to schedule a MRI for her mother  before her up coming appointment on 2/27. Please advise.

## 2022-08-01 NOTE — Telephone Encounter (Signed)
Jan per last OV note with Vicie Mutters PA it states pt will be due for MRI in Feb and they will be contacted closer to that date. Do you know who a reminder was or will be sent to for this?

## 2022-08-13 ENCOUNTER — Ambulatory Visit (HOSPITAL_COMMUNITY)
Admission: RE | Admit: 2022-08-13 | Discharge: 2022-08-13 | Disposition: A | Discharge status: Home / Self Care | Payer: Medicare Other | Source: Ambulatory Visit | Attending: Gastroenterology | Admitting: Gastroenterology

## 2022-08-13 ENCOUNTER — Other Ambulatory Visit: Payer: Self-pay | Admitting: Gastroenterology

## 2022-08-13 DIAGNOSIS — R194 Change in bowel habit: Secondary | ICD-10-CM | POA: Diagnosis not present

## 2022-08-13 DIAGNOSIS — K862 Cyst of pancreas: Secondary | ICD-10-CM

## 2022-08-13 MED ORDER — GADOBUTROL 1 MMOL/ML IV SOLN
7.5000 mL | Freq: Once | INTRAVENOUS | Status: AC | PRN
Start: 2022-08-13 — End: 2022-08-13
  Administered 2022-08-13: 7.5 mL via INTRAVENOUS

## 2022-08-14 ENCOUNTER — Other Ambulatory Visit: Payer: Self-pay | Admitting: Gastroenterology

## 2022-08-21 ENCOUNTER — Encounter: Payer: Self-pay | Admitting: Gastroenterology

## 2022-08-21 ENCOUNTER — Ambulatory Visit: Payer: Medicare Other | Admitting: Gastroenterology

## 2022-08-21 VITALS — BP 118/62 | HR 56 | Ht 66.0 in | Wt 164.4 lb

## 2022-08-21 DIAGNOSIS — R140 Abdominal distension (gaseous): Secondary | ICD-10-CM | POA: Diagnosis not present

## 2022-08-21 DIAGNOSIS — K862 Cyst of pancreas: Secondary | ICD-10-CM

## 2022-08-21 DIAGNOSIS — R9389 Abnormal findings on diagnostic imaging of other specified body structures: Secondary | ICD-10-CM | POA: Diagnosis not present

## 2022-08-21 DIAGNOSIS — R634 Abnormal weight loss: Secondary | ICD-10-CM

## 2022-08-21 NOTE — Progress Notes (Signed)
08/21/2022 Ashley Clements [id number] 1946/02/04  Referring provider: Lucianne Lei, MD  ASSESSMENT AND PLAN:   Change in bowel habits, bloating, weight loss of 8 pounds over a year.  Some early satiety.  Symptoms now improving. She has gained 2 pounds since her last visit.  Negative pancreatic elastase Colon and EGD unremarkable other than gastritis, patient doing well on PPI. Borderline positive SIBO, treated doxycycline, some response and bowel habits not helping bloating. Patient already is diagnosed with lactose intolerance. Fdgard samples given. Discussed Ashley Clements's Tummy Tamers.  Consider GES is symptoms return.   Abnormal CT scan 02/01/2022 abnormal CT with pancreatic cyst  02/21/2022 MRI no high risk imaging features of pancreatic head cyst, recall 6 months. 08/13/2022 showed persistent pancreatic cyst findings Surveillance MRI recommended in 12 months  Benign neoplasm of cecum 01/30/2022 colonoscopy showed left-sided diverticulosis, 2 mm descending colon polyp, 1 mm cecal polyp, 1 polyp tubular adenoma the other 1 lymphoid aggregate.   Surveillance colonoscopy not recommended due to current age.   Patient Care Team: Lucianne Lei, MD as PCP - General (Family Medicine)  HISTORY OF PRESENT ILLNESS: 77 y.o. female with a past medical history of hyperlipidemia, hypertension, arthritis, anemia, reflux, status post hysterectomy and others listed below presents for evaluation of abdominal pain and diarrhea status post SIBO treatment. She returns today in follow-up. Her daughter accompanies her to this appointment.   01/30/2022 colonoscopy showed left-sided diverticulosis, 2 mm descending colon polyp, 1 mm cecal polyp, 1 polyp tubular adenoma the other 1 lymphoid aggregate 01/30/2022 EGD showed gastritis and duodenal diverticulum second portion of duodenum, unremarkable biopsies. 02/01/2022 CT abdomen pelvis with contrast 2.1 cm left adrenal adenoma, 2.1 cm fluid density structure  anterior aspect of the head of the pancreas 1.5 cm hypervascular structure in the liver. 02/21/2022 MRI for abnormality seen on CT scan showed large macrocystic lesion measuring 3 x 1.6 x 1.2 cm no high risk imaging features recommend imaging follow-up 6 months.  Also include hepatic steatosis, hepatic hemangiomas and small right pleural effusion. 08/13/22 MRI to follow-up on pancreatic cyst shows mild decrease in the size of a 1.7 cm cystic lesion in the pancreatic neck which communicates with the main pancreatic duct.  This is suggestive of an indolent cystic neoplasm such as serous cyst adenoma or sidebranch IPMN.  Repeat MRI recommended in 1 year.  SIBO test was negative however, given her elevated and sustained hydrogen level, the test may still support SIBO so she was treated with 2 weeks of doxycyline, which unfortunately seemed to worsen her symptoms initially. But now, she is overall feeling better and wondered if it was related to the antibiotics.   She has some morning bloating that improves after a bowel movement. She is having 1-2 formed bowel movements daily with a sense of complete evacuation. She didn't find that the FDGuard provided much.   She is eating more and even started gaining some weight.  Early satiety has largely resolved.   Wt Readings from Last 3 Encounters:  08/21/22 164 lb 7 oz (74.6 kg)  08/13/22 162 lb (73.5 kg)  06/13/22 162 lb (73.5 kg)      She  reports that she has never smoked. She has never used smokeless tobacco. She reports that she does not drink alcohol and does not use drugs.  Current Medications:   Current Outpatient Medications (Endocrine & Metabolic):    raloxifene (EVISTA) 60 MG tablet, Take 60 mg by mouth daily.  Current Outpatient Medications (Cardiovascular):  rosuvastatin (CRESTOR) 20 MG tablet, Take 20 mg by mouth daily.   valsartan-hydrochlorothiazide (DIOVAN-HCT) 80-12.5 MG per tablet, Take 1 tablet by mouth daily.  Current  Outpatient Medications (Respiratory):    fluticasone (FLONASE) 50 MCG/ACT nasal spray, Place 2 sprays into both nostrils daily.   montelukast (SINGULAIR) 10 MG tablet, Take 10 mg by mouth at bedtime.   Current Outpatient Medications (Hematological):    ferrous sulfate 325 (65 FE) MG tablet, Take 325 mg by mouth daily with breakfast.  Current Outpatient Medications (Other):    citalopram (CELEXA) 10 MG tablet, Take 10 mg by mouth daily.   Multiple Vitamins-Minerals (MULTIVITAMINS THER. W/MINERALS) TABS tablet, Take by mouth.   OVER THE COUNTER MEDICATION, Digestive Advantage -1 capsule by mouth daily   pantoprazole (PROTONIX) 40 MG tablet, Take 40 mg by mouth daily as needed.   pantoprazole (PROTONIX) 40 MG tablet, TAKE 1 TABLET(40 MG) BY MOUTH TWICE DAILY BEFORE A MEAL   traZODone (DESYREL) 50 MG tablet, Take 1 tablet by mouth daily.  Medical History:  Past Medical History:  Diagnosis Date   Anemia    Arthritis    Diabetes mellitus    borderline   GERD (gastroesophageal reflux disease)    Hypertension    Allergies:  Allergies  Allergen Reactions   Latex Rash   Peanut-Containing Drug Products Rash    All nuts     Surgical History:  She  has a past surgical history that includes Cholecystectomy; Abdominal hysterectomy; Eye surgery; Total knee arthroplasty (04/03/2012); and Uterine fibroid surgery. Family History:  Her family history includes Diabetes in her maternal grandmother; Heart disease in her father.   PHYSICAL EXAM: Ht '5\' 6"'$  (1.676 m)   Wt 164 lb 7 oz (74.6 kg)   BMI 26.54 kg/m  General:   Pleasant, well developed female in no acute distress Head:   Normocephalic and atraumatic. Eyes:  sclerae anicteric,conjunctive pink  Abdomen:   Soft, Obese AB, Active bowel sounds. No tenderness . Without guarding and Without rebound, No organomegaly appreciated. Neurologic:  Alert and  oriented x4;  No focal deficits.  Skin:   Dry and intact without significant lesions or  rashes. Psychiatric:  Cooperative. Normal mood and affect.  RELEVANT LABS AND IMAGING: CBC    Component Value Date/Time   WBC 5.3 04/24/2018 0901   RBC 3.96 04/24/2018 0901   HGB 10.7 (L) 04/24/2018 0901   HCT 34.8 (L) 04/24/2018 0901   PLT 218 04/24/2018 0901   MCV 87.9 04/24/2018 0901   MCH 27.0 04/24/2018 0901   MCHC 30.7 04/24/2018 0901   RDW 14.3 04/24/2018 0901   LYMPHSABS 1.8 04/24/2018 0901   MONOABS 0.3 04/24/2018 0901   EOSABS 0.2 04/24/2018 0901   BASOSABS 0.0 04/24/2018 0901    CMP     Component Value Date/Time   NA 140 04/24/2018 0901   K 3.6 04/24/2018 0901   CL 110 04/24/2018 0901   CO2 24 04/24/2018 0901   GLUCOSE 115 (H) 04/24/2018 0901   BUN 12 04/24/2018 0901   CREATININE 0.61 04/24/2018 0901   CALCIUM 8.6 (L) 04/24/2018 0901   PROT 6.2 (L) 04/24/2018 0901   ALBUMIN 3.3 (L) 04/24/2018 0901   AST 21 04/24/2018 0901   ALT 14 04/24/2018 0901   ALKPHOS 37 (L) 04/24/2018 0901   BILITOT 0.4 04/24/2018 0901   GFRNONAA >60 04/24/2018 0901   GFRAA >60 04/24/2018 0901   I spent 30 minutes, including in depth chart review, independent review of results, communicating results  with the patient directly, face-to-face time with the patient, coordinating care, and ordering studies and medications as appropriate, and documentation.   Thornton Park, MD 10:07 AM

## 2022-08-21 NOTE — Patient Instructions (Addendum)
I am so glad you are feeling better.   I am recommending a trial of FDGard - 2 capsules taken twice daily for 4 weeks.  Please take FDGard 30 to 60 minutes before meals with water.  There is no distinct pattern of side effects with FDGard, but, sometimes patients experience a mild tingling sensation in the gut within the first 30 minutes. This sensation should subsequently subside.    We discussed [name]'s Tummy Tamers from [employer] as another option to use as needed if your symptoms return.  Please let me know if your symptoms are worsening.   I have recommended another MRI in February 2025. Please let me know if you need anything before that time.  _______________________________________________________  If your blood pressure at your visit was 140/90 or greater, please contact your primary care physician to follow up on this.  _______________________________________________________  If you are age 56 or older, your body mass index should be between 23-30. Your Body mass index is 26.54 kg/m. If this is out of the aforementioned range listed, please consider follow up with your Primary Care Provider.  If you are age 61 or younger, your body mass index should be between 19-25. Your Body mass index is 26.54 kg/m. If this is out of the aformentioned range listed, please consider follow up with your Primary Care Provider.   ________________________________________________________  The [facility] providers would like to encourage you to use [facility] to communicate with providers for non-urgent requests or questions.  Due to long hold times on the telephone, sending your provider a message by [facility] may be a faster and more efficient way to get a response.  Please allow 48 business hours for a response.  Please remember that this is for non-urgent requests.  _______________________________________________________

## 2022-08-23 ENCOUNTER — Telehealth: Payer: Self-pay

## 2022-08-23 NOTE — Telephone Encounter (Signed)
-----   Message from Carl Best, RN sent at 02/21/2022  1:05 PM EDT ----- Patient needs repeating MRI pancreas 6 month follow up. Refer to imaging result note 02/21/22.

## 2022-08-23 NOTE — Telephone Encounter (Signed)
The pt has had repeat MRI see results note

## 2022-08-28 DIAGNOSIS — R7303 Prediabetes: Secondary | ICD-10-CM | POA: Diagnosis not present

## 2022-08-28 DIAGNOSIS — E559 Vitamin D deficiency, unspecified: Secondary | ICD-10-CM | POA: Diagnosis not present

## 2022-08-28 DIAGNOSIS — E7800 Pure hypercholesterolemia, unspecified: Secondary | ICD-10-CM | POA: Diagnosis not present

## 2022-08-28 DIAGNOSIS — K219 Gastro-esophageal reflux disease without esophagitis: Secondary | ICD-10-CM | POA: Diagnosis not present

## 2022-08-28 DIAGNOSIS — G4700 Insomnia, unspecified: Secondary | ICD-10-CM | POA: Diagnosis not present

## 2022-08-28 DIAGNOSIS — I10 Essential (primary) hypertension: Secondary | ICD-10-CM | POA: Diagnosis not present

## 2022-10-09 DIAGNOSIS — G4700 Insomnia, unspecified: Secondary | ICD-10-CM | POA: Diagnosis not present

## 2022-10-09 DIAGNOSIS — F4381 Prolonged grief disorder: Secondary | ICD-10-CM | POA: Diagnosis not present

## 2022-10-09 DIAGNOSIS — R7303 Prediabetes: Secondary | ICD-10-CM | POA: Diagnosis not present

## 2022-10-09 DIAGNOSIS — I10 Essential (primary) hypertension: Secondary | ICD-10-CM | POA: Diagnosis not present

## 2022-10-09 DIAGNOSIS — M130 Polyarthritis, unspecified: Secondary | ICD-10-CM | POA: Diagnosis not present

## 2022-10-09 DIAGNOSIS — E782 Mixed hyperlipidemia: Secondary | ICD-10-CM | POA: Diagnosis not present

## 2023-01-08 DIAGNOSIS — E7800 Pure hypercholesterolemia, unspecified: Secondary | ICD-10-CM | POA: Diagnosis not present

## 2023-01-08 DIAGNOSIS — E1169 Type 2 diabetes mellitus with other specified complication: Secondary | ICD-10-CM | POA: Diagnosis not present

## 2023-01-08 DIAGNOSIS — I10 Essential (primary) hypertension: Secondary | ICD-10-CM | POA: Diagnosis not present

## 2023-02-19 DIAGNOSIS — I10 Essential (primary) hypertension: Secondary | ICD-10-CM | POA: Diagnosis not present

## 2023-02-19 DIAGNOSIS — R7303 Prediabetes: Secondary | ICD-10-CM | POA: Diagnosis not present

## 2023-04-16 DIAGNOSIS — M19071 Primary osteoarthritis, right ankle and foot: Secondary | ICD-10-CM | POA: Diagnosis not present

## 2023-05-16 ENCOUNTER — Other Ambulatory Visit: Payer: Self-pay | Admitting: *Deleted

## 2023-05-16 MED ORDER — PANTOPRAZOLE SODIUM 40 MG PO TBEC: 40.0000 mg | DELAYED_RELEASE_TABLET | Freq: Two times a day (BID) | ORAL | 1 refills | Status: AC

## 2023-06-11 DIAGNOSIS — E7800 Pure hypercholesterolemia, unspecified: Secondary | ICD-10-CM | POA: Diagnosis not present

## 2023-06-11 DIAGNOSIS — I10 Essential (primary) hypertension: Secondary | ICD-10-CM | POA: Diagnosis not present

## 2023-07-30 ENCOUNTER — Telehealth: Payer: Self-pay | Admitting: *Deleted

## 2023-07-30 NOTE — Telephone Encounter (Signed)
-----   Message from [facility] V sent at 08/21/2022 10:42 AM EST ----- Regarding: MRI Patient needs MRI 07/2023 for pancreatic cyst

## 2023-08-14 ENCOUNTER — Telehealth: Payer: Self-pay

## 2023-08-14 NOTE — Telephone Encounter (Signed)
-----   Message from Nurse Joselyn Glassman sent at 08/13/2022  4:17 PM EST ----- Personal reminder sent 08/13/2022   Repeat MRI in 1 year ( Dr. Orvan Falconer)

## 2023-08-14 NOTE — Telephone Encounter (Signed)
Received reminder in Epic. Former Dr. Orvan Falconer pt.  Please advise  as DOD if recommendations are to repeat MRI.

## 2023-08-16 ENCOUNTER — Other Ambulatory Visit: Payer: Self-pay

## 2023-08-16 DIAGNOSIS — K862 Cyst of pancreas: Secondary | ICD-10-CM

## 2023-08-16 NOTE — Telephone Encounter (Signed)
Unable to reach patient, VM box full. MRI order placed.

## 2023-08-19 NOTE — Telephone Encounter (Signed)
 Unable to reach pt. Pt voice mail not set up

## 2023-08-20 NOTE — Telephone Encounter (Signed)
 Unable to reach pt. Pt voice mail not set up

## 2023-08-21 NOTE — Telephone Encounter (Signed)
 Unable to reach pt. Pt voice mail not set up

## 2023-08-22 NOTE — Telephone Encounter (Signed)
Letter mailed home to patient.

## 2023-08-23 NOTE — Telephone Encounter (Signed)
 See telephone contact from 08/14/23

## 2023-10-10 DIAGNOSIS — E782 Mixed hyperlipidemia: Secondary | ICD-10-CM | POA: Diagnosis not present

## 2023-10-10 DIAGNOSIS — R231 Pallor: Secondary | ICD-10-CM | POA: Diagnosis not present

## 2023-10-10 DIAGNOSIS — E119 Type 2 diabetes mellitus without complications: Secondary | ICD-10-CM | POA: Diagnosis not present

## 2023-10-10 DIAGNOSIS — R7303 Prediabetes: Secondary | ICD-10-CM | POA: Diagnosis not present

## 2023-10-17 DIAGNOSIS — Z1211 Encounter for screening for malignant neoplasm of colon: Secondary | ICD-10-CM | POA: Diagnosis not present

## 2023-10-17 DIAGNOSIS — Z1212 Encounter for screening for malignant neoplasm of rectum: Secondary | ICD-10-CM | POA: Diagnosis not present

## 2023-10-23 LAB — COLOGUARD: COLOGUARD: NEGATIVE

## 2023-10-24 DIAGNOSIS — I10 Essential (primary) hypertension: Secondary | ICD-10-CM | POA: Diagnosis not present

## 2023-10-24 DIAGNOSIS — E7800 Pure hypercholesterolemia, unspecified: Secondary | ICD-10-CM | POA: Diagnosis not present

## 2023-10-24 DIAGNOSIS — E119 Type 2 diabetes mellitus without complications: Secondary | ICD-10-CM | POA: Diagnosis not present

## 2023-12-04 ENCOUNTER — Other Ambulatory Visit (HOSPITAL_COMMUNITY): Payer: Self-pay | Admitting: Family Medicine

## 2023-12-04 DIAGNOSIS — Z1231 Encounter for screening mammogram for malignant neoplasm of breast: Secondary | ICD-10-CM

## 2023-12-05 ENCOUNTER — Ambulatory Visit (HOSPITAL_COMMUNITY)
Admission: RE | Admit: 2023-12-05 | Discharge: 2023-12-05 | Disposition: A | Discharge status: Home / Self Care | Source: Ambulatory Visit | Attending: Family Medicine | Admitting: Family Medicine

## 2023-12-05 ENCOUNTER — Encounter (HOSPITAL_COMMUNITY): Payer: Self-pay

## 2023-12-05 DIAGNOSIS — Z1231 Encounter for screening mammogram for malignant neoplasm of breast: Secondary | ICD-10-CM | POA: Insufficient documentation

## 2023-12-12 NOTE — Telephone Encounter (Signed)
 error

## 2024-03-23 DIAGNOSIS — E782 Mixed hyperlipidemia: Secondary | ICD-10-CM | POA: Diagnosis not present

## 2024-03-23 DIAGNOSIS — R7303 Prediabetes: Secondary | ICD-10-CM | POA: Diagnosis not present

## 2024-03-23 DIAGNOSIS — G5603 Carpal tunnel syndrome, bilateral upper limbs: Secondary | ICD-10-CM | POA: Diagnosis not present

## 2024-04-20 ENCOUNTER — Encounter: Payer: Self-pay | Admitting: *Deleted

## 2024-04-20 NOTE — Progress Notes (Signed)
 Ashley Clements                                          MRN: [id number]   04/20/2024   The [facility] Quality Team Specialist reviewed this patient medical record for the purposes of chart review for care gap closure. The following were reviewed: chart review for care gap closure-kidney health evaluation for diabetes:eGFR  and uACR.    [facility] Quality Team

## 2024-04-27 ENCOUNTER — Encounter (HOSPITAL_COMMUNITY): Payer: Self-pay

## 2024-04-27 ENCOUNTER — Ambulatory Visit (HOSPITAL_COMMUNITY): Attending: Family Medicine

## 2024-04-27 DIAGNOSIS — M6281 Muscle weakness (generalized): Secondary | ICD-10-CM | POA: Insufficient documentation

## 2024-04-27 DIAGNOSIS — M542 Cervicalgia: Secondary | ICD-10-CM | POA: Insufficient documentation

## 2024-04-27 NOTE — Therapy (Signed)
 OUTPATIENT PHYSICAL THERAPY CERVICAL EVALUATION   Patient Name: Ashley Clements MRN: [id number] DOB:1945-07-05, 78 y.o., female Today's Date: 04/27/2024  END OF SESSION:  PT End of Session - 04/27/24 0857     Visit Number 1    Number of Visits 8    Date for Recertification  06/19/24    Authorization Type UHC Medicare    Authorization Time Period authorization requested    PT Start Time 0857    PT Stop Time 0934    PT Time Calculation (min) 37 min    Activity Tolerance Patient tolerated treatment well    Behavior During Therapy [facility] for tasks assessed/performed          Past Medical History:  Diagnosis Date   Anemia    Arthritis    Diabetes mellitus    borderline   GERD (gastroesophageal reflux disease)    Hypertension    Past Surgical History:  Procedure Laterality Date   ABDOMINAL HYSTERECTOMY     CHOLECYSTECTOMY     EYE SURGERY     Cataract left, Retina  detachment left   TOTAL KNEE ARTHROPLASTY  04/03/2012   Procedure: TOTAL KNEE ARTHROPLASTY;  Surgeon: [place] JULIANNA Pepper, MD;  Location: [facility] OR;  Service: Orthopedics;  Laterality: Right;   UTERINE FIBROID SURGERY     Patient Active Problem List   Diagnosis Date Noted   Syncope 04/24/2018   Essential hypertension 04/24/2018   Arthritis of knee 04/24/2018   Right-sided chest wall pain    Knee pain 04/30/2012   Knee stiffness 04/30/2012   Difficulty walking 04/30/2012    PCP: Benjamine Aland, MD   REFERRING PROVIDER: Benjamine Aland, MD   REFERRING DIAG: neck pain   THERAPY DIAG:  Cervicalgia  Muscle weakness (generalized)  Rationale for Evaluation and Treatment: Rehabilitation  ONSET DATE: years ago  SUBJECTIVE:                                                                                                                                                                                                         SUBJECTIVE STATEMENT: Patient reports that she has been having neck pain on and off for years.  She then begin having wrist pin in the past month. She feels that her pain is becoming more frequent than it used to be. However, she is unable to recall any injury that could have caused this. She had never had any pain like this previously.  Hand dominance: Right  PERTINENT HISTORY:  HTN, OA, and latex allergy   PAIN:  Are you having pain?  Yes: NPRS scale: Current: 0/10 Worst: 10/10 Pain location: neck and radiates to left shoulder Pain description: stinging Aggravating factors: none known Relieving factors: using topical cream   PRECAUTIONS: None  RED FLAGS: None     WEIGHT BEARING RESTRICTIONS: No  FALLS:  Has patient fallen in last 6 months? No  LIVING ENVIRONMENT: Lives with: lives alone Lives in: House/apartment  OCCUPATION: retired  PLOF: Independent  PATIENT GOALS: reduced pain, and improve mobility  NEXT MD VISIT: mid November 2025  OBJECTIVE:  Note: Objective measures were completed at Evaluation unless otherwise noted.  PATIENT SURVEYS:  NDI:  NECK DISABILITY INDEX  Date: 04/27/24 Score  Pain intensity 0 = I have no pain at the moment  2. Personal care (washing, dressing, etc.) 0 = I can look after myself normally without causing extra pain  3. Lifting 4 =  I can only lift very light weights  4. Reading 0 = I can read as much as I want to with no pain in my neck  5. Headaches 3 = I have moderate headaches, which come frequently  6. Concentration 2 = I have a fair degree of difficulty in concentrating when I want to  7. Work 0 =  I can do as much work as I want to  8. Driving 0 = I can drive my car without any neck pain  9. Sleeping 4 = My sleep is greatly disturbed (3-5 hrs sleepless)   10. Recreation 0 = I am able to engage in all my recreation activities with no neck pain at all  Total 13/50   Minimum Detectable Change (90% confidence): 5 points or 10% points  COGNITION: Overall cognitive status: Within functional limits for tasks  assessed  SENSATION: Light touch: Impaired  and diminished sensation at C4 dermatome on the right and C5-T1 dermatomes bilaterally  Patient reports numbness in her right hand  POSTURE: forward head  PALPATION: TTP: bilateral upper trapezius with palpation to L upper trapezius reproducing her familiar symptoms   CERVICAL ROM: patient exhibited difficulty isolating cervical rotation   Active ROM A/PROM (deg) eval  Flexion 15  Extension 50  Right lateral flexion 25% limited; going to the left feels better than the right  Left lateral flexion 25% limited  Right rotation 60  Left rotation 48   (Blank rows = not tested)  UPPER EXTREMITY ROM: WFL for activities assessed  UPPER EXTREMITY MMT:  MMT Right eval Left eval  Shoulder flexion 3+/5 3+/5  Shoulder extension    Shoulder abduction    Shoulder adduction 3+/5 3+/5; shoulder pain   Shoulder extension    Shoulder internal rotation    Shoulder external rotation    Middle trapezius    Lower trapezius    Elbow flexion 4+/5 4/5  Elbow extension 4+/5 4/5  Wrist flexion    Wrist extension    Wrist ulnar deviation    Wrist radial deviation    Wrist pronation    Wrist supination    Grip strength     (Blank rows = not tested)  CERVICAL SPECIAL TESTS:  Spurling's test: Negative, Distraction test: Positive, and Sharp pursor's test: Negative  TREATMENT DATE:  04/27/24: PT evaluation, patient education, and HEP    PATIENT EDUCATION:  Education details: POC, HEP, anatomy, healing, objective findings, and goals for physical therapy Person educated: Patient Education method: Explanation, Demonstration, and Handouts Education comprehension: verbalized understanding and returned demonstration  HOME EXERCISE PROGRAM: Access Code: [email or web address] URL: [email or web address] Date:  04/27/2024 Prepared by: Lacinda Fass  Exercises - Seated Upper Trapezius Stretch  - 1 x daily - 7 x weekly - 3 sets - 30 seconds hold - Seated Scapular Retraction  - 1 x daily - 7 x weekly - 3 sets - 10 reps  ASSESSMENT:  CLINICAL IMPRESSION: Patient is a 78 y.o. female who was seen today for physical therapy evaluation and treatment for chronic neck pain. She presented with low pain severity and irritability with palpation to her left upper trapezius reproducing her familiar symptoms. She also exhibited reduced upper extremity strength bilaterally. Recommend that she continue with skilled physical therapy to address her impairments to return to her prior level of function.    OBJECTIVE IMPAIRMENTS: decreased activity tolerance, decreased ROM, decreased strength, impaired tone, impaired UE functional use, and pain.   ACTIVITY LIMITATIONS: lifting and sleeping  PARTICIPATION LIMITATIONS: community activity  PERSONAL FACTORS: Time since onset of injury/illness/exacerbation and 3+ comorbidities: HTN, OA, and latex allergy  are also affecting patient's functional outcome.   REHAB POTENTIAL: Good  CLINICAL DECISION MAKING: Evolving/moderate complexity  EVALUATION COMPLEXITY: Moderate   GOALS: Goals reviewed with patient? No  LONG TERM GOALS: Target date: 05/25/24  Patient will be independent with her HEP.  Baseline:  Goal status: INITIAL  2.  Patient will improve her NDI score to 7/50 or less for improved perceived function with her daily activities.  Baseline:  Goal status: INITIAL  3.  Patient will improve her left cervical rotation to at least 55 degrees for improved awareness of her surroundings.  Baseline:  Goal status: INITIAL  4.  Patient will be able to complete her daily activities without her familiar symptoms exceeding 8/10. Baseline:  Goal status: INITIAL  5.  Patient will improve her bilateral shoulder flexor strength to at least 4-/5 for improved function  lifting.  Baseline:  Goal status: INITIAL  PLAN:  PT FREQUENCY: 1-2x/week  PT DURATION: 4 weeks  PLANNED INTERVENTIONS: 02835- PT Re-evaluation, 97750- Physical Performance Testing, 97110-Therapeutic exercises, 97530- Therapeutic activity, 97112- Neuromuscular re-education, 97535- Self Care, 02859- Manual therapy, G0283- Electrical stimulation (unattended), 20560 (1-2 muscles), 20561 (3+ muscles)- Dry Needling, Patient/Family education, Joint mobilization, Spinal mobilization, Cryotherapy, and Moist heat  PLAN FOR NEXT SESSION: review and update HEP, postural reeducation, and upper extremity strengthening   Lacinda JAYSON Fass, PT 04/27/2024, 11:26 AM  [employer] Medicare Auth Request Information Treatment Start Date: 04/27/24  Date of referral: 03/27/24 Referring provider: Benjamine Aland, MD  Referring diagnosis (ICD 10)? M54.2 Treatment diagnosis (ICD 10)? (if different than referring diagnosis) M54.2, M62.81  What was this (referring dx) caused by? Ongoing Issue  Lysle of Condition: Chronic (continuous duration > 3 months)   Laterality: Lt  Current Functional Measure Score: Neck Index 13/50  Objective measurements identify impairments when they are compared to normal values, the uninvolved extremity, and prior level of function.  [x]  Yes  []  No  Objective assessment of functional ability: Minimal functional limitations   Briefly describe symptoms: sharp pain in her neck   How did symptoms start: no known cause  Average pain intensity:  Last 24 hours: 5-6/10  Past week: 6-7/10  How often does the pt experience symptoms? Frequently  How much have the symptoms interfered with usual daily activities? Moderately  How has condition changed since care began at this facility? NA - initial visit  In general, how is the patients overall health? Good   BACK PAIN (STarT Back Screening Tool) No

## 2024-04-29 ENCOUNTER — Ambulatory Visit (HOSPITAL_COMMUNITY): Admitting: Physical Therapy

## 2024-04-29 DIAGNOSIS — M542 Cervicalgia: Secondary | ICD-10-CM

## 2024-04-29 DIAGNOSIS — M6281 Muscle weakness (generalized): Secondary | ICD-10-CM

## 2024-04-29 NOTE — Therapy (Signed)
 OUTPATIENT PHYSICAL THERAPY CERVICAL TREATEMENT   Patient Name: LUCITA MONTOYA MRN: [id number] DOB:[birth year], 78 y.o., female Today's Date: 04/29/2024  END OF SESSION:  PT End of Session - 04/29/24 1045     Visit Number 2    Number of Visits 8    Date for Recertification  06/19/24    Authorization Type UHC Medicare    Authorization Time Period authorization requested    PT Start Time 0905    PT Stop Time 0945    PT Time Calculation (min) 40 min    Activity Tolerance Patient tolerated treatment well    Behavior During Therapy WFL for tasks assessed/performed           Past Medical History:  Diagnosis Date   Anemia    Arthritis    Diabetes mellitus    borderline   GERD (gastroesophageal reflux disease)    Hypertension    Past Surgical History:  Procedure Laterality Date   ABDOMINAL HYSTERECTOMY     CHOLECYSTECTOMY     EYE SURGERY     Cataract left, Retina  detachment left   TOTAL KNEE ARTHROPLASTY  04/03/2012   Procedure: TOTAL KNEE ARTHROPLASTY;  Surgeon: [place] JULIANNA Pepper, MD;  Location: [facility] OR;  Service: Orthopedics;  Laterality: Right;   UTERINE FIBROID SURGERY     Patient Active Problem List   Diagnosis Date Noted   Syncope 04/24/2018   Essential hypertension 04/24/2018   Arthritis of knee 04/24/2018   Right-sided chest wall pain    Knee pain 04/30/2012   Knee stiffness 04/30/2012   Difficulty walking 04/30/2012    PCP: Benjamine Aland, MD   REFERRING PROVIDER: Benjamine Aland, MD   REFERRING DIAG: neck pain   THERAPY DIAG:  No diagnosis found.  Rationale for Evaluation and Treatment: Rehabilitation  ONSET DATE: years ago  SUBJECTIVE:                                                                                                                                                                                                         SUBJECTIVE STATEMENT: 04/29/24:  Pain comes and goes with sharp shooting pains.  Currently without pain.  Reports  compliance with HEP.   Evaluation:  Patient reports that she has been having neck pain on and off for years. She then begin having wrist pin in the past month. She feels that her pain is becoming more frequent than it used to be. However, she is unable to recall any injury that could have caused this. She had never had any pain like this  previously.  Hand dominance: Right  PERTINENT HISTORY:  HTN, OA, and latex allergy   PAIN:  Are you having pain? Yes: NPRS scale: Current: 0/10 Worst: 10/10 Pain location: neck and radiates to left shoulder Pain description: stinging Aggravating factors: none known Relieving factors: using topical cream   PRECAUTIONS: None  RED FLAGS: None     WEIGHT BEARING RESTRICTIONS: No  FALLS:  Has patient fallen in last 6 months? No  LIVING ENVIRONMENT: Lives with: lives alone Lives in: House/apartment  OCCUPATION: retired  PLOF: Independent  PATIENT GOALS: reduced pain, and improve mobility  NEXT MD VISIT: mid November 2025  OBJECTIVE:  Note: Objective measures were completed at Evaluation unless otherwise noted.  PATIENT SURVEYS:  NDI:  NECK DISABILITY INDEX  Date: 04/27/24 Score  Pain intensity 0 = I have no pain at the moment  2. Personal care (washing, dressing, etc.) 0 = I can look after myself normally without causing extra pain  3. Lifting 4 =  I can only lift very light weights  4. Reading 0 = I can read as much as I want to with no pain in my neck  5. Headaches 3 = I have moderate headaches, which come frequently  6. Concentration 2 = I have a fair degree of difficulty in concentrating when I want to  7. Work 0 =  I can do as much work as I want to  8. Driving 0 = I can drive my car without any neck pain  9. Sleeping 4 = My sleep is greatly disturbed (3-5 hrs sleepless)   10. Recreation 0 = I am able to engage in all my recreation activities with no neck pain at all  Total 13/50   Minimum Detectable Change (90% confidence): 5  points or 10% points  COGNITION: Overall cognitive status: Within functional limits for tasks assessed  SENSATION: Light touch: Impaired  and diminished sensation at C4 dermatome on the right and C5-T1 dermatomes bilaterally  Patient reports numbness in her right hand  POSTURE: forward head  PALPATION: TTP: bilateral upper trapezius with palpation to L upper trapezius reproducing her familiar symptoms   CERVICAL ROM: patient exhibited difficulty isolating cervical rotation   Active ROM A/PROM (deg) eval  Flexion 15  Extension 50  Right lateral flexion 25% limited; going to the left feels better than the right  Left lateral flexion 25% limited  Right rotation 60  Left rotation 48   (Blank rows = not tested)  UPPER EXTREMITY ROM: WFL for activities assessed  UPPER EXTREMITY MMT:  MMT Right eval Left eval  Shoulder flexion 3+/5 3+/5  Shoulder extension    Shoulder abduction    Shoulder adduction 3+/5 3+/5; shoulder pain   Shoulder extension    Shoulder internal rotation    Shoulder external rotation    Middle trapezius    Lower trapezius    Elbow flexion 4+/5 4/5  Elbow extension 4+/5 4/5  Wrist flexion    Wrist extension    Wrist ulnar deviation    Wrist radial deviation    Wrist pronation    Wrist supination    Grip strength     (Blank rows = not tested)  CERVICAL SPECIAL TESTS:  Spurling's test: Negative, Distraction test: Positive, and Sharp pursor's test: Negative  TREATMENT DATE:  04/29/24 Goal review HEP review Seated:  cervical ROM 5X each direction  Thoracic excursions with UE movements 5X each direction  Scapular retractions 10X5  UE flexion 10X  UT stretch 2X30 each side Standing doorway stretch Seated:  manual STM to bil UT/scap region    04/27/24: PT evaluation, patient education, and HEP    PATIENT EDUCATION:   Education details: POC, HEP, anatomy, healing, objective findings, and goals for physical therapy Person educated: Patient Education method: Explanation, Demonstration, and Handouts Education comprehension: verbalized understanding and returned demonstration  HOME EXERCISE PROGRAM: Access Code: [email or web address] URL: [email or web address] Date: 04/27/2024 Prepared by: Lacinda Fass Exercises - Seated Upper Trapezius Stretch  - 1 x daily - 7 x weekly - 3 sets - 30 seconds hold - Seated Scapular Retraction  - 1 x daily - 7 x weekly - 3 sets - 10 reps  Access Code: [email or web address] URL: [email or web address] Date: 04/29/2024 - Standing Shoulder Flexion Full Range  - 2 x daily - 7 x weekly - 10 reps - Doorway Pec Stretch at 90 Degrees Abduction  - 2 x daily - 7 x weekly - 3 reps - 30 second hold Thoracic excursions with UE movements 5X each  Cervical ROM all 5X each  ASSESSMENT:  CLINICAL IMPRESSION: Reviewed goals and POC moving forward. Pt was unable to recall or complete HEP correctly. Max verbal and tactile cues to complete in correct form as well as new exercises added this session. Printed out and given to pt to add to HEP.  Manual completed with trigger point in Lt upper trap.  Therapist was able to resolve spasm with manual techniques and resultant reduction in pain verbalized.  Pt will continue to benefit from skilled physical therapy to address her impairments to return to her prior level of function.    OBJECTIVE IMPAIRMENTS: decreased activity tolerance, decreased ROM, decreased strength, impaired tone, impaired UE functional use, and pain.   ACTIVITY LIMITATIONS: lifting and sleeping  PARTICIPATION LIMITATIONS: community activity  PERSONAL FACTORS: Time since onset of injury/illness/exacerbation and 3+ comorbidities: HTN, OA, and latex allergy  are also affecting patient's functional outcome.   REHAB POTENTIAL: Good  CLINICAL DECISION MAKING:  Evolving/moderate complexity  EVALUATION COMPLEXITY: Moderate   GOALS: Goals reviewed with patient? Yes  LONG TERM GOALS: Target date: 05/25/24  Patient will be independent with her HEP.  Baseline:  Goal status: INITIAL  2.  Patient will improve her NDI score to 7/50 or less for improved perceived function with her daily activities.  Baseline:  Goal status: INITIAL  3.  Patient will improve her left cervical rotation to at least 55 degrees for improved awareness of her surroundings.  Baseline:  Goal status: INITIAL  4.  Patient will be able to complete her daily activities without her familiar symptoms exceeding 8/10. Baseline:  Goal status: INITIAL  5.  Patient will improve her bilateral shoulder flexor strength to at least 4-/5 for improved function lifting.  Baseline:  Goal status: INITIAL  PLAN:  PT FREQUENCY: 1-2x/week  PT DURATION: 4 weeks  PLANNED INTERVENTIONS: 97164- PT Re-evaluation, 97750- Physical Performance Testing, 97110-Therapeutic exercises, 97530- Therapeutic activity, [id number]- Neuromuscular re-education, 97535- Self Care, 02859- Manual therapy, G0283- Electrical stimulation (unattended), 20560 (1-2 muscles), 20561 (3+ muscles)- Dry Needling, Patient/Family education, Joint mobilization, Spinal mobilization, Cryotherapy, and Moist heat  PLAN FOR NEXT SESSION:postural reeducation, and upper extremity strengthening  Vivian Greig NOVAK, PTA 04/29/2024, 10:46 AM  Greig NOVAK Vivian, PTA/CLT [facility] Ph: [phone]

## 2024-05-06 ENCOUNTER — Ambulatory Visit (HOSPITAL_COMMUNITY): Admitting: Physical Therapy

## 2024-05-06 DIAGNOSIS — M542 Cervicalgia: Secondary | ICD-10-CM

## 2024-05-06 DIAGNOSIS — M6281 Muscle weakness (generalized): Secondary | ICD-10-CM

## 2024-05-06 NOTE — Therapy (Signed)
 OUTPATIENT PHYSICAL THERAPY CERVICAL TREATEMENT   Patient Name: Ashley Clements MRN: [id number] DOB:Oct 23, 1945, 78 y.o., female Today's Date: 05/06/2024  END OF SESSION:  PT End of Session - 05/06/24 0824     Visit Number 3    Number of Visits 8    Date for Recertification  06/19/24    Authorization Type [employer] Medicare    Authorization Time Period authorization requested    PT Start Time 0820    PT Stop Time 0900    PT Time Calculation (min) 40 min    Activity Tolerance Patient tolerated treatment well    Behavior During Therapy WFL for tasks assessed/performed           Past Medical History:  Diagnosis Date   Anemia    Arthritis    Diabetes mellitus    borderline   GERD (gastroesophageal reflux disease)    Hypertension    Past Surgical History:  Procedure Laterality Date   ABDOMINAL HYSTERECTOMY     CHOLECYSTECTOMY     EYE SURGERY     Cataract left, Retina  detachment left   TOTAL KNEE ARTHROPLASTY  04/03/2012   Procedure: TOTAL KNEE ARTHROPLASTY;  Surgeon: Rome JULIANNA Pepper, MD;  Location: [facility] OR;  Service: Orthopedics;  Laterality: Right;   UTERINE FIBROID SURGERY     Patient Active Problem List   Diagnosis Date Noted   Syncope 04/24/2018   Essential hypertension 04/24/2018   Arthritis of knee 04/24/2018   Right-sided chest wall pain    Knee pain 04/30/2012   Knee stiffness 04/30/2012   Difficulty walking 04/30/2012    PCP: Benjamine Aland, MD   REFERRING PROVIDER: Benjamine Aland, MD   REFERRING DIAG: neck pain   THERAPY DIAG: Cervicalgia  Muscle weakness (generalized)  Rationale for Evaluation and Treatment: Rehabilitation  ONSET DATE: years ago  SUBJECTIVE:                                                                                                                                                                                                         SUBJECTIVE STATEMENT: 05/06/24:  Pt reports it's much better.  Only having pain in end range of  motion with rotating to Left that elevates to 6/10 at that point.   No pain at rest, only aggravation in lt shoulder/upper trap.   Evaluation:  Patient reports that she has been having neck pain on and off for years. She then begin having wrist pin in the past month. She feels that her pain is becoming more frequent than it used to be.  However, she is unable to recall any injury that could have caused this. She had never had any pain like this previously.  Hand dominance: Right  PERTINENT HISTORY:  HTN, OA, and latex allergy   PAIN:  Are you having pain? Yes: NPRS scale: Current: 0/10 Worst: 10/10 Pain location: neck and radiates to left shoulder Pain description: stinging Aggravating factors: none known Relieving factors: using topical cream   PRECAUTIONS: None  RED FLAGS: None     WEIGHT BEARING RESTRICTIONS: No  FALLS:  Has patient fallen in last 6 months? No  LIVING ENVIRONMENT: Lives with: lives alone Lives in: House/apartment  OCCUPATION: retired  PLOF: Independent  PATIENT GOALS: reduced pain, and improve mobility  NEXT MD VISIT: mid November 2025  OBJECTIVE:  Note: Objective measures were completed at Evaluation unless otherwise noted.  PATIENT SURVEYS:  NDI:  NECK DISABILITY INDEX  Date: 04/27/24 Score  Pain intensity 0 = I have no pain at the moment  2. Personal care (washing, dressing, etc.) 0 = I can look after myself normally without causing extra pain  3. Lifting 4 =  I can only lift very light weights  4. Reading 0 = I can read as much as I want to with no pain in my neck  5. Headaches 3 = I have moderate headaches, which come frequently  6. Concentration 2 = I have a fair degree of difficulty in concentrating when I want to  7. Work 0 =  I can do as much work as I want to  8. Driving 0 = I can drive my car without any neck pain  9. Sleeping 4 = My sleep is greatly disturbed (3-5 hrs sleepless)   10. Recreation 0 = I am able to engage in all my  recreation activities with no neck pain at all  Total 13/50   Minimum Detectable Change (90% confidence): 5 points or 10% points  COGNITION: Overall cognitive status: Within functional limits for tasks assessed  SENSATION: Light touch: Impaired  and diminished sensation at C4 dermatome on the right and C5-T1 dermatomes bilaterally  Patient reports numbness in her right hand  POSTURE: forward head  PALPATION: TTP: bilateral upper trapezius with palpation to L upper trapezius reproducing her familiar symptoms   CERVICAL ROM: patient exhibited difficulty isolating cervical rotation   Active ROM A/PROM (deg) eval  Flexion 15  Extension 50  Right lateral flexion 25% limited; going to the left feels better than the right  Left lateral flexion 25% limited  Right rotation 60  Left rotation 48   (Blank rows = not tested)  UPPER EXTREMITY ROM: WFL for activities assessed  UPPER EXTREMITY MMT:  MMT Right eval Left eval  Shoulder flexion 3+/5 3+/5  Shoulder extension    Shoulder abduction    Shoulder adduction 3+/5 3+/5; shoulder pain   Shoulder extension    Shoulder internal rotation    Shoulder external rotation    Middle trapezius    Lower trapezius    Elbow flexion 4+/5 4/5  Elbow extension 4+/5 4/5  Wrist flexion    Wrist extension    Wrist ulnar deviation    Wrist radial deviation    Wrist pronation    Wrist supination    Grip strength     (Blank rows = not tested)  CERVICAL SPECIAL TESTS:  Spurling's test: Negative, Distraction test: Positive, and Sharp pursor's test: Negative  TREATMENT DATE:  05/06/24 Standing: RTB rows 2X10  RTB shoulder extension 2X10  Doorway stretch 2X30 Seated:  cervical ROM 5X each direction  Thoracic excursions with UE movements 5X each direction  Scapular retractions 10X5  UE flexion 10X  UT stretch  2X30 each side Seated:  manual STM to bil UT/scap region   04/29/24 Goal review HEP review Seated:  cervical ROM 5X each direction  Thoracic excursions with UE movements 5X each direction  Scapular retractions 10X5  UE flexion 10X  UT stretch 2X30 each side Standing doorway stretch Seated:  manual STM to bil UT/scap region    04/27/24: PT evaluation, patient education, and HEP    PATIENT EDUCATION:  Education details: POC, HEP, anatomy, healing, objective findings, and goals for physical therapy Person educated: Patient Education method: Explanation, Demonstration, and Handouts Education comprehension: verbalized understanding and returned demonstration  HOME EXERCISE PROGRAM: Access Code: [email or web address] URL: [email or web address] Date: 04/27/2024 Prepared by: Lacinda Fass Exercises - Seated Upper Trapezius Stretch  - 1 x daily - 7 x weekly - 3 sets - 30 seconds hold - Seated Scapular Retraction  - 1 x daily - 7 x weekly - 3 sets - 10 reps  Access Code: [email or web address] URL: [email or web address] Date: 04/29/2024 - Standing Shoulder Flexion Full Range  - 2 x daily - 7 x weekly - 10 reps - Doorway Pec Stretch at 90 Degrees Abduction  - 2 x daily - 7 x weekly - 3 reps - 30 second hold Thoracic excursions with UE movements 5X each  Cervical ROM all 5X each  ASSESSMENT:  CLINICAL IMPRESSION: Began session with postural strengthening using therabands.  Pt with difficulty completing in correct form, requiring manual cues to correct.  Pt tends to round out back during eccentric phase of exercises and with little control upon return.  Able to correct this with improved control.  Continued with other established therex with VC's to complete more slowly/controlled and to be attentive to posturing.  Pt also requires assistance keeping count of repetitions as she tends to do too many.  Pt given print out of exercises initiated last session as she had forgotten them  at the clinic. Manual completed with large spasm in Lt upper trap.  Able to fully resolve this with manual today and pt reported much improvement in pain at end of session.   Pt will continue to benefit from skilled physical therapy to address her impairments to return to her prior level of function.    OBJECTIVE IMPAIRMENTS: decreased activity tolerance, decreased ROM, decreased strength, impaired tone, impaired UE functional use, and pain.   ACTIVITY LIMITATIONS: lifting and sleeping  PARTICIPATION LIMITATIONS: community activity  PERSONAL FACTORS: Time since onset of injury/illness/exacerbation and 3+ comorbidities: HTN, OA, and latex allergy  are also affecting patient's functional outcome.   REHAB POTENTIAL: Good  CLINICAL DECISION MAKING: Evolving/moderate complexity  EVALUATION COMPLEXITY: Moderate   GOALS: Goals reviewed with patient? Yes  LONG TERM GOALS: Target date: 05/25/24  Patient will be independent with her HEP.  Baseline:  Goal status: INITIAL  2.  Patient will improve her NDI score to 7/50 or less for improved perceived function with her daily activities.  Baseline:  Goal status: INITIAL  3.  Patient will improve her left cervical rotation to at least 55 degrees for improved awareness of her surroundings.  Baseline:  Goal status: INITIAL  4.  Patient will be able to complete her daily activities without her familiar symptoms exceeding 8/10. Baseline:  Goal status: INITIAL  5.  Patient will improve her bilateral shoulder flexor strength to at least 4-/5 for improved function lifting.  Baseline:  Goal status: INITIAL  PLAN:  PT FREQUENCY: 1-2x/week  PT DURATION: 4 weeks  PLANNED INTERVENTIONS: 97164- PT Re-evaluation, 97750- Physical Performance Testing, 97110-Therapeutic exercises, 97530- Therapeutic activity, [id number]- Neuromuscular re-education, 97535- Self Care, 02859- Manual therapy, [id number]- Electrical stimulation (unattended), 20560 (1-2 muscles),  20561 (3+ muscles)- Dry Needling, Patient/Family education, Joint mobilization, Spinal mobilization, Cryotherapy, and Moist heat  PLAN FOR NEXT SESSION:postural reeducation, and upper extremity strengthening. Manual as needed. Update HEP as needed.   Vivian Greig NOVAK, PTA 05/06/2024, 8:24 AM  Greig NOVAK Vivian, PTA/CLT [facility] Ph: [phone]

## 2024-05-08 ENCOUNTER — Encounter (HOSPITAL_COMMUNITY): Payer: Self-pay

## 2024-05-08 ENCOUNTER — Ambulatory Visit (HOSPITAL_COMMUNITY)

## 2024-05-08 DIAGNOSIS — M542 Cervicalgia: Secondary | ICD-10-CM

## 2024-05-08 DIAGNOSIS — M6281 Muscle weakness (generalized): Secondary | ICD-10-CM

## 2024-05-08 NOTE — Therapy (Signed)
 OUTPATIENT PHYSICAL THERAPY CERVICAL TREATEMENT   Patient Name: Ashley Clements MRN: [id number] DOB:1945-07-24, 78 y.o., female Today's Date: 05/08/2024  END OF SESSION:  PT End of Session - 05/08/24 0817     Visit Number 4    Number of Visits 8    Date for Recertification  06/19/24    Authorization Type [employer] Medicare    Authorization Time Period 8 approved, 04/27/24-05/25/24    Authorization - Number of Visits 8    Progress Note Due on Visit 8    PT Start Time 0818    PT Stop Time 0856    PT Time Calculation (min) 38 min    Activity Tolerance Patient tolerated treatment well    Behavior During Therapy WFL for tasks assessed/performed           Past Medical History:  Diagnosis Date   Anemia    Arthritis    Diabetes mellitus    borderline   GERD (gastroesophageal reflux disease)    Hypertension    Past Surgical History:  Procedure Laterality Date   ABDOMINAL HYSTERECTOMY     CHOLECYSTECTOMY     EYE SURGERY     Cataract left, Retina  detachment left   TOTAL KNEE ARTHROPLASTY  04/03/2012   Procedure: TOTAL KNEE ARTHROPLASTY;  Surgeon: [place] JULIANNA Pepper, MD;  Location: [facility] OR;  Service: Orthopedics;  Laterality: Right;   UTERINE FIBROID SURGERY     Patient Active Problem List   Diagnosis Date Noted   Syncope 04/24/2018   Essential hypertension 04/24/2018   Arthritis of knee 04/24/2018   Right-sided chest wall pain    Knee pain 04/30/2012   Knee stiffness 04/30/2012   Difficulty walking 04/30/2012    PCP: Benjamine Aland, MD   REFERRING PROVIDER: Benjamine Aland, MD   REFERRING DIAG: neck pain   THERAPY DIAG: Cervicalgia  Muscle weakness (generalized)  Rationale for Evaluation and Treatment: Rehabilitation  ONSET DATE: years ago  SUBJECTIVE:                                                                                                                                                                                                         SUBJECTIVE  STATEMENT: Pt states she was feeling pretty rough yesterday, got some wood in and was feeling sore. Pt states neck pain is a 8/10 this morning. Pt states she is trying to do HEP everyday.   Evaluation:  Patient reports that she has been having neck pain on and off for years. She then begin having wrist pin in the past month.  She feels that her pain is becoming more frequent than it used to be. However, she is unable to recall any injury that could have caused this. She had never had any pain like this previously.  Hand dominance: Right  PERTINENT HISTORY:  HTN, OA, and latex allergy   PAIN:  Are you having pain? Yes: NPRS scale: Current: 0/10 Worst: 10/10 Pain location: neck and radiates to left shoulder Pain description: stinging Aggravating factors: none known Relieving factors: using topical cream   PRECAUTIONS: None  RED FLAGS: None     WEIGHT BEARING RESTRICTIONS: No  FALLS:  Has patient fallen in last 6 months? No  LIVING ENVIRONMENT: Lives with: lives alone Lives in: House/apartment  OCCUPATION: retired  PLOF: Independent  PATIENT GOALS: reduced pain, and improve mobility  NEXT MD VISIT: mid November 2025  OBJECTIVE:  Note: Objective measures were completed at Evaluation unless otherwise noted.  PATIENT SURVEYS:  NDI:  NECK DISABILITY INDEX  Date: 04/27/24 Score  Pain intensity 0 = I have no pain at the moment  2. Personal care (washing, dressing, etc.) 0 = I can look after myself normally without causing extra pain  3. Lifting 4 =  I can only lift very light weights  4. Reading 0 = I can read as much as I want to with no pain in my neck  5. Headaches 3 = I have moderate headaches, which come frequently  6. Concentration 2 = I have a fair degree of difficulty in concentrating when I want to  7. Work 0 =  I can do as much work as I want to  8. Driving 0 = I can drive my car without any neck pain  9. Sleeping 4 = My sleep is greatly disturbed (3-5 hrs  sleepless)   10. Recreation 0 = I am able to engage in all my recreation activities with no neck pain at all  Total 13/50   Minimum Detectable Change (90% confidence): 5 points or 10% points  COGNITION: Overall cognitive status: Within functional limits for tasks assessed  SENSATION: Light touch: Impaired  and diminished sensation at C4 dermatome on the right and C5-T1 dermatomes bilaterally  Patient reports numbness in her right hand  POSTURE: forward head  PALPATION: TTP: bilateral upper trapezius with palpation to L upper trapezius reproducing her familiar symptoms   CERVICAL ROM: patient exhibited difficulty isolating cervical rotation   Active ROM A/PROM (deg) eval  Flexion 15  Extension 50  Right lateral flexion 25% limited; going to the left feels better than the right  Left lateral flexion 25% limited  Right rotation 60  Left rotation 48   (Blank rows = not tested)  UPPER EXTREMITY ROM: WFL for activities assessed  UPPER EXTREMITY MMT:  MMT Right eval Left eval  Shoulder flexion 3+/5 3+/5  Shoulder extension    Shoulder abduction    Shoulder adduction 3+/5 3+/5; shoulder pain   Shoulder extension    Shoulder internal rotation    Shoulder external rotation    Middle trapezius    Lower trapezius    Elbow flexion 4+/5 4/5  Elbow extension 4+/5 4/5  Wrist flexion    Wrist extension    Wrist ulnar deviation    Wrist radial deviation    Wrist pronation    Wrist supination    Grip strength     (Blank rows = not tested)  CERVICAL SPECIAL TESTS:  Spurling's test: Negative, Distraction test: Positive, and Sharp pursor's test: Negative  TREATMENT  DATE:                                                                                                                               05/08/2024  Manual Therapy: -CPA of Cervical spinal segments C2-C7, grade I-II mobilizations -Segmental lateral flexion mobilizations of cervical spine, bilaterally -Cervical  distractions, 3 reps 10 second holds -STM of Cervical Paraspinal musculature (bilateral upper trap/sub occipitals) Therapeutic Exercise: -Bilateral ER, 2 sets of 15 reps,  Horizontal abductions, and diagonals, 2 sets of 10 reps, pt cued for sequencing and neutral cervical spine -Cervical snags rotations, 1 set of 10 reps bilaterally, pt cued for sequencing -UT and Levator stretches, 1 rep, 15 second holds bilaterally, tactile cues needed Neuromuscular Re-education: -Seated chin tucks, 1 set of 10 reps, 3 second holds, pt cued to remain in pain free ROM  05/06/24 Standing: RTB rows 2X10  RTB shoulder extension 2X10  Doorway stretch 2X30 Seated:  cervical ROM 5X each direction  Thoracic excursions with UE movements 5X each direction  Scapular retractions 10X5  UE flexion 10X  UT stretch 2X30 each side Seated:  manual STM to bil UT/scap region   04/29/24 Goal review HEP review Seated:  cervical ROM 5X each direction  Thoracic excursions with UE movements 5X each direction  Scapular retractions 10X5  UE flexion 10X  UT stretch 2X30 each side Standing doorway stretch Seated:  manual STM to bil UT/scap region     PATIENT EDUCATION:  Education details: POC, HEP, anatomy, healing, objective findings, and goals for physical therapy Person educated: Patient Education method: Explanation, Demonstration, and Handouts Education comprehension: verbalized understanding and returned demonstration  HOME EXERCISE PROGRAM: Access Code: [email or web address] URL: [email or web address] Date: 04/27/2024 Prepared by: Lacinda Fass Exercises - Seated Upper Trapezius Stretch  - 1 x daily - 7 x weekly - 3 sets - 30 seconds hold - Seated Scapular Retraction  - 1 x daily - 7 x weekly - 3 sets - 10 reps  Access Code: [email or web address] URL: [email or web address] Date: 04/29/2024 - Standing Shoulder Flexion Full Range  - 2 x daily - 7 x weekly - 10 reps - Doorway Pec Stretch at 90  Degrees Abduction  - 2 x daily - 7 x weekly - 3 reps - 30 second hold Thoracic excursions with UE movements 5X each  Cervical ROM all 5X each  ASSESSMENT:  CLINICAL IMPRESSION: Patient demonstrates decreased coordination of cervical spine with UE movements and chin tuck, requires tactile cueing for proper form. Pt demonstrates increased pain is cervical spine onf L side with increased tone in bilateral upper traps noted. Patient also demonstrates decreased endurance with postural strengthening during today's session. Patient able to progress dynamic scapular movements and cervical spine activation exercises today with cervical rotations and UT/Levator stretches, good performance with verbal cueing. Patient would continue to benefit from skilled physical therapy for decreased neck pain, increased endurance with cervical mobility, and increased postural strength for improved quality of life,  improved independence with gait training and continued progress towards therapy goals.    OBJECTIVE IMPAIRMENTS: decreased activity tolerance, decreased ROM, decreased strength, impaired tone, impaired UE functional use, and pain.   ACTIVITY LIMITATIONS: lifting and sleeping  PARTICIPATION LIMITATIONS: community activity  PERSONAL FACTORS: Time since onset of injury/illness/exacerbation and 3+ comorbidities: HTN, OA, and latex allergy  are also affecting patient's functional outcome.   REHAB POTENTIAL: Good  CLINICAL DECISION MAKING: Evolving/moderate complexity  EVALUATION COMPLEXITY: Moderate   GOALS: Goals reviewed with patient? Yes  LONG TERM GOALS: Target date: 05/25/24  Patient will be independent with her HEP.  Baseline:  Goal status: INITIAL  2.  Patient will improve her NDI score to 7/50 or less for improved perceived function with her daily activities.  Baseline:  Goal status: INITIAL  3.  Patient will improve her left cervical rotation to at least 55 degrees for improved awareness  of her surroundings.  Baseline:  Goal status: INITIAL  4.  Patient will be able to complete her daily activities without her familiar symptoms exceeding 8/10. Baseline:  Goal status: INITIAL  5.  Patient will improve her bilateral shoulder flexor strength to at least 4-/5 for improved function lifting.  Baseline:  Goal status: INITIAL  PLAN:  PT FREQUENCY: 1-2x/week  PT DURATION: 4 weeks  PLANNED INTERVENTIONS: 97164- PT Re-evaluation, 97750- Physical Performance Testing, 97110-Therapeutic exercises, 97530- Therapeutic activity, [id number]- Neuromuscular re-education, 97535- Self Care, 02859- Manual therapy, G0283- Electrical stimulation (unattended), 20560 (1-2 muscles), 20561 (3+ muscles)- Dry Needling, Patient/Family education, Joint mobilization, Spinal mobilization, Cryotherapy, and Moist heat  PLAN FOR NEXT SESSION:postural reeducation, and upper extremity strengthening. Manual as needed. Update HEP as needed.   Lang Ada, PT, DPT [facility] Office: [phone] 9:05 AM, 05/08/24

## 2024-05-12 ENCOUNTER — Ambulatory Visit (HOSPITAL_COMMUNITY)

## 2024-05-12 ENCOUNTER — Encounter (HOSPITAL_COMMUNITY): Payer: Self-pay

## 2024-05-12 DIAGNOSIS — M542 Cervicalgia: Secondary | ICD-10-CM

## 2024-05-12 DIAGNOSIS — M6281 Muscle weakness (generalized): Secondary | ICD-10-CM

## 2024-05-12 NOTE — Therapy (Signed)
 OUTPATIENT PHYSICAL THERAPY CERVICAL TREATEMENT   Patient Name: Ashley Clements MRN: [id number] DOB:August 03, 1945, 78 y.o., female Today's Date: 05/12/2024  END OF SESSION:  PT End of Session - 05/12/24 0949     Visit Number 5    Number of Visits 8    Date for Recertification  06/19/24    Authorization Type [employer] Medicare    Authorization Time Period 8 approved, 04/27/24-05/25/24    Authorization - Visit Number 5    Authorization - Number of Visits 8    Progress Note Due on Visit 8    PT Start Time 0950    PT Stop Time 1029    PT Time Calculation (min) 39 min    Activity Tolerance Patient tolerated treatment well    Behavior During Therapy WFL for tasks assessed/performed           Past Medical History:  Diagnosis Date   Anemia    Arthritis    Diabetes mellitus    borderline   GERD (gastroesophageal reflux disease)    Hypertension    Past Surgical History:  Procedure Laterality Date   ABDOMINAL HYSTERECTOMY     CHOLECYSTECTOMY     EYE SURGERY     Cataract left, Retina  detachment left   TOTAL KNEE ARTHROPLASTY  04/03/2012   Procedure: TOTAL KNEE ARTHROPLASTY;  Surgeon: Rome JULIANNA Pepper, MD;  Location: [facility] OR;  Service: Orthopedics;  Laterality: Right;   UTERINE FIBROID SURGERY     Patient Active Problem List   Diagnosis Date Noted   Syncope 04/24/2018   Essential hypertension 04/24/2018   Arthritis of knee 04/24/2018   Right-sided chest wall pain    Knee pain 04/30/2012   Knee stiffness 04/30/2012   Difficulty walking 04/30/2012    PCP: Benjamine Aland, MD   REFERRING PROVIDER: Benjamine Aland, MD   REFERRING DIAG: neck pain   THERAPY DIAG: Cervicalgia  Muscle weakness (generalized)  Rationale for Evaluation and Treatment: Rehabilitation  ONSET DATE: years ago  SUBJECTIVE:                                                                                                                                                                                                          SUBJECTIVE STATEMENT: Pt reports no pain in neck this morning. No issues. Reports she does have some wrist pain at times. Hurts right in the bend of it. Notices it when she wakes up some mornings.    Evaluation:  Patient reports that she has been having neck pain on and off for years. She  then begin having wrist pin in the past month. She feels that her pain is becoming more frequent than it used to be. However, she is unable to recall any injury that could have caused this. She had never had any pain like this previously.  Hand dominance: Right  PERTINENT HISTORY:  HTN, OA, and latex allergy   PAIN:  Are you having pain? Yes: NPRS scale: Current: 0/10 Worst: 10/10 Pain location: neck and radiates to left shoulder Pain description: stinging Aggravating factors: none known Relieving factors: using topical cream   PRECAUTIONS: None  RED FLAGS: None     WEIGHT BEARING RESTRICTIONS: No  FALLS:  Has patient fallen in last 6 months? No  LIVING ENVIRONMENT: Lives with: lives alone Lives in: House/apartment  OCCUPATION: retired  PLOF: Independent  PATIENT GOALS: reduced pain, and improve mobility  NEXT MD VISIT: mid November 2025  OBJECTIVE:  Note: Objective measures were completed at Evaluation unless otherwise noted.  PATIENT SURVEYS:  NDI:  NECK DISABILITY INDEX  Date: 04/27/24 Score  Pain intensity 0 = I have no pain at the moment  2. Personal care (washing, dressing, etc.) 0 = I can look after myself normally without causing extra pain  3. Lifting 4 =  I can only lift very light weights  4. Reading 0 = I can read as much as I want to with no pain in my neck  5. Headaches 3 = I have moderate headaches, which come frequently  6. Concentration 2 = I have a fair degree of difficulty in concentrating when I want to  7. Work 0 =  I can do as much work as I want to  8. Driving 0 = I can drive my car without any neck pain  9. Sleeping 4 = My sleep is greatly  disturbed (3-5 hrs sleepless)   10. Recreation 0 = I am able to engage in all my recreation activities with no neck pain at all  Total 13/50   Minimum Detectable Change (90% confidence): 5 points or 10% points  COGNITION: Overall cognitive status: Within functional limits for tasks assessed  SENSATION: Light touch: Impaired  and diminished sensation at C4 dermatome on the right and C5-T1 dermatomes bilaterally  Patient reports numbness in her right hand  POSTURE: forward head  PALPATION: TTP: bilateral upper trapezius with palpation to L upper trapezius reproducing her familiar symptoms   CERVICAL ROM: patient exhibited difficulty isolating cervical rotation   Active ROM A/PROM (deg) eval  Flexion 15  Extension 50  Right lateral flexion 25% limited; going to the left feels better than the right  Left lateral flexion 25% limited  Right rotation 60  Left rotation 48   (Blank rows = not tested)  UPPER EXTREMITY ROM: WFL for activities assessed  UPPER EXTREMITY MMT:  MMT Right eval Left eval  Shoulder flexion 3+/5 3+/5  Shoulder extension    Shoulder abduction    Shoulder adduction 3+/5 3+/5; shoulder pain   Shoulder extension    Shoulder internal rotation    Shoulder external rotation    Middle trapezius    Lower trapezius    Elbow flexion 4+/5 4/5  Elbow extension 4+/5 4/5  Wrist flexion    Wrist extension    Wrist ulnar deviation    Wrist radial deviation    Wrist pronation    Wrist supination    Grip strength     (Blank rows = not tested)  CERVICAL SPECIAL TESTS:  Spurling's test: Negative, Distraction  test: Positive, and Sharp pursor's test: Negative  TREATMENT DATE:       05/12/24: UBE, level 1, forward 2.5, backward 2.5 Posterior shoulder rolls, 2' Self Flex/Ext SNAG with towel, v cues for mild tension at painful area, 2' Chin tucks w/ Self mobs using towel at painful area, v cues for form, 1' Upper Trap stretch, 2x30  Levator Scapulae stretch,  2x30 STM of Cervical Paraspinal musculature (bilateral upper trap/sub occipitals)                                                                                                                            05/08/2024  Manual Therapy: -CPA of Cervical spinal segments C2-C7, grade I-II mobilizations -Segmental lateral flexion mobilizations of cervical spine, bilaterally -Cervical distractions, 3 reps 10 second holds -STM of Cervical Paraspinal musculature (bilateral upper trap/sub occipitals) Therapeutic Exercise: -Bilateral ER, 2 sets of 15 reps,  Horizontal abductions, and diagonals, 2 sets of 10 reps, pt cued for sequencing and neutral cervical spine -Cervical snags rotations, 1 set of 10 reps bilaterally, pt cued for sequencing -UT and Levator stretches, 1 rep, 15 second holds bilaterally, tactile cues needed Neuromuscular Re-education: -Seated chin tucks, 1 set of 10 reps, 3 second holds, pt cued to remain in pain free ROM  05/06/24 Standing: RTB rows 2X10  RTB shoulder extension 2X10  Doorway stretch 2X30 Seated:  cervical ROM 5X each direction  Thoracic excursions with UE movements 5X each direction  Scapular retractions 10X5  UE flexion 10X  UT stretch 2X30 each side Seated:  manual STM to bil UT/scap region    PATIENT EDUCATION:  Education details: POC, HEP, anatomy, healing, objective findings, and goals for physical therapy Person educated: Patient Education method: Explanation, Demonstration, and Handouts Education comprehension: verbalized understanding and returned demonstration  HOME EXERCISE PROGRAM: Access Code: [email or web address] URL: [email or web address] Date: 04/27/2024 Prepared by: Lacinda Fass Exercises - Seated Upper Trapezius Stretch  - 1 x daily - 7 x weekly - 3 sets - 30 seconds hold - Seated Scapular Retraction  - 1 x daily - 7 x weekly - 3 sets - 10 reps  Access Code: [email or web address] URL: [email or web address] Date:  04/29/2024 - Standing Shoulder Flexion Full Range  - 2 x daily - 7 x weekly - 10 reps - Doorway Pec Stretch at 90 Degrees Abduction  - 2 x daily - 7 x weekly - 3 reps - 30 second hold Thoracic excursions with UE movements 5X each  Cervical ROM all 5X each  ASSESSMENT:  CLINICAL IMPRESSION: Patient arrives to session with reports of little neck pain this date. Began with dynamic warm up of tissues of UBE, pt limited slightly due to wrist pain only. Followed with postural re-education activities, self mobilizations to cervical spine during AROM, and stretching of upper cervical musculature. Pt reports tightness and sore spots throughout. Ended with STM focused on knots in bilateral upper trapezius muscles for pain control. Patient would continue to benefit from skilled physical therapy  for decreased neck pain, increased endurance with cervical mobility, and increased postural strength for improved quality of life, improved independence with gait training and continued progress towards therapy goals.      OBJECTIVE IMPAIRMENTS: decreased activity tolerance, decreased ROM, decreased strength, impaired tone, impaired UE functional use, and pain.   ACTIVITY LIMITATIONS: lifting and sleeping  PARTICIPATION LIMITATIONS: community activity  PERSONAL FACTORS: Time since onset of injury/illness/exacerbation and 3+ comorbidities: HTN, OA, and latex allergy  are also affecting patient's functional outcome.   REHAB POTENTIAL: Good  CLINICAL DECISION MAKING: Evolving/moderate complexity  EVALUATION COMPLEXITY: Moderate   GOALS: Goals reviewed with patient? Yes  LONG TERM GOALS: Target date: 05/25/24  Patient will be independent with her HEP.  Baseline:  Goal status: INITIAL  2.  Patient will improve her NDI score to 7/50 or less for improved perceived function with her daily activities.  Baseline:  Goal status: INITIAL  3.  Patient will improve her left cervical rotation to at least 55  degrees for improved awareness of her surroundings.  Baseline:  Goal status: INITIAL  4.  Patient will be able to complete her daily activities without her familiar symptoms exceeding 8/10. Baseline:  Goal status: INITIAL  5.  Patient will improve her bilateral shoulder flexor strength to at least 4-/5 for improved function lifting.  Baseline:  Goal status: INITIAL  PLAN:  PT FREQUENCY: 1-2x/week  PT DURATION: 4 weeks  PLANNED INTERVENTIONS: 97164- PT Re-evaluation, 97750- Physical Performance Testing, 97110-Therapeutic exercises, 97530- Therapeutic activity, [id number]- Neuromuscular re-education, 97535- Self Care, 02859- Manual therapy, G0283- Electrical stimulation (unattended), 20560 (1-2 muscles), 20561 (3+ muscles)- Dry Needling, Patient/Family education, Joint mobilization, Spinal mobilization, Cryotherapy, and Moist heat  PLAN FOR NEXT SESSION:postural reeducation, and upper extremity strengthening. Manual as needed. Update HEP as needed.   10:39 AM, 05/12/24 Rosaria Settler, PT, DPT [facility] [place]

## 2024-05-13 ENCOUNTER — Ambulatory Visit (HOSPITAL_COMMUNITY): Admitting: Physical Therapy

## 2024-05-14 ENCOUNTER — Ambulatory Visit (HOSPITAL_COMMUNITY)

## 2024-05-14 ENCOUNTER — Encounter (HOSPITAL_COMMUNITY): Payer: Self-pay

## 2024-05-14 DIAGNOSIS — M6281 Muscle weakness (generalized): Secondary | ICD-10-CM

## 2024-05-14 DIAGNOSIS — M542 Cervicalgia: Secondary | ICD-10-CM

## 2024-05-14 NOTE — Therapy (Signed)
 OUTPATIENT PHYSICAL THERAPY CERVICAL TREATEMENT   Patient Name: Ashley Clements MRN: [id number] DOB:1945/12/02, 78 y.o., female Today's Date: 05/14/2024  END OF SESSION:  PT End of Session - 05/14/24 1112     Visit Number 6    Number of Visits 8    Date for Recertification  06/19/24    Authorization Type [employer] Medicare    Authorization Time Period 8 approved, 04/27/24-05/25/24    Authorization - Visit Number 6    Authorization - Number of Visits 8    Progress Note Due on Visit 8    PT Start Time 1112    PT Stop Time 1152    PT Time Calculation (min) 40 min    Activity Tolerance Patient tolerated treatment well    Behavior During Therapy WFL for tasks assessed/performed            Past Medical History:  Diagnosis Date   Anemia    Arthritis    Diabetes mellitus    borderline   GERD (gastroesophageal reflux disease)    Hypertension    Past Surgical History:  Procedure Laterality Date   ABDOMINAL HYSTERECTOMY     CHOLECYSTECTOMY     EYE SURGERY     Cataract left, Retina  detachment left   TOTAL KNEE ARTHROPLASTY  04/03/2012   Procedure: TOTAL KNEE ARTHROPLASTY;  Surgeon: Rome JULIANNA Pepper, MD;  Location: [facility] OR;  Service: Orthopedics;  Laterality: Right;   UTERINE FIBROID SURGERY     Patient Active Problem List   Diagnosis Date Noted   Syncope 04/24/2018   Essential hypertension 04/24/2018   Arthritis of knee 04/24/2018   Right-sided chest wall pain    Knee pain 04/30/2012   Knee stiffness 04/30/2012   Difficulty walking 04/30/2012    PCP: Benjamine Aland, MD   REFERRING PROVIDER: Benjamine Aland, MD   REFERRING DIAG: neck pain   THERAPY DIAG: Cervicalgia  Muscle weakness (generalized)  Rationale for Evaluation and Treatment: Rehabilitation  ONSET DATE: years ago  SUBJECTIVE:                                                                                                                                                                                                          SUBJECTIVE STATEMENT: Patient reports that she is hurting a little more today. She reported that her pain comes and goes.   Evaluation:  Patient reports that she has been having neck pain on and off for years. She then begin having wrist pin in the past month. She feels that her pain is becoming  more frequent than it used to be. However, she is unable to recall any injury that could have caused this. She had never had any pain like this previously.  Hand dominance: Right  PERTINENT HISTORY:  HTN, OA, and latex allergy   PAIN:  Are you having pain? Yes: NPRS scale: Current: moderate Worst: 10/10 Pain location: neck and radiates to left shoulder Pain description: stinging Aggravating factors: none known Relieving factors: using topical cream   PRECAUTIONS: None  RED FLAGS: None     WEIGHT BEARING RESTRICTIONS: No  FALLS:  Has patient fallen in last 6 months? No  LIVING ENVIRONMENT: Lives with: lives alone Lives in: House/apartment  OCCUPATION: retired  PLOF: Independent  PATIENT GOALS: reduced pain, and improve mobility  NEXT MD VISIT: mid November 2025  OBJECTIVE:  Note: Objective measures were completed at Evaluation unless otherwise noted.  PATIENT SURVEYS:  NDI:  NECK DISABILITY INDEX  Date: 04/27/24 Score  Pain intensity 0 = I have no pain at the moment  2. Personal care (washing, dressing, etc.) 0 = I can look after myself normally without causing extra pain  3. Lifting 4 =  I can only lift very light weights  4. Reading 0 = I can read as much as I want to with no pain in my neck  5. Headaches 3 = I have moderate headaches, which come frequently  6. Concentration 2 = I have a fair degree of difficulty in concentrating when I want to  7. Work 0 =  I can do as much work as I want to  8. Driving 0 = I can drive my car without any neck pain  9. Sleeping 4 = My sleep is greatly disturbed (3-5 hrs sleepless)   10. Recreation 0 = I am able to  engage in all my recreation activities with no neck pain at all  Total 13/50   Minimum Detectable Change (90% confidence): 5 points or 10% points  COGNITION: Overall cognitive status: Within functional limits for tasks assessed  SENSATION: Light touch: Impaired  and diminished sensation at C4 dermatome on the right and C5-T1 dermatomes bilaterally  Patient reports numbness in her right hand  POSTURE: forward head  PALPATION: TTP: bilateral upper trapezius with palpation to L upper trapezius reproducing her familiar symptoms   CERVICAL ROM: patient exhibited difficulty isolating cervical rotation   Active ROM A/PROM (deg) eval  Flexion 15  Extension 50  Right lateral flexion 25% limited; going to the left feels better than the right  Left lateral flexion 25% limited  Right rotation 60  Left rotation 48   (Blank rows = not tested)  UPPER EXTREMITY ROM: WFL for activities assessed  UPPER EXTREMITY MMT:  MMT Right eval Left eval  Shoulder flexion 3+/5 3+/5  Shoulder extension    Shoulder abduction    Shoulder adduction 3+/5 3+/5; shoulder pain   Shoulder extension    Shoulder internal rotation    Shoulder external rotation    Middle trapezius    Lower trapezius    Elbow flexion 4+/5 4/5  Elbow extension 4+/5 4/5  Wrist flexion    Wrist extension    Wrist ulnar deviation    Wrist radial deviation    Wrist pronation    Wrist supination    Grip strength     (Blank rows = not tested)  CERVICAL SPECIAL TESTS:  Spurling's test: Negative, Distraction test: Positive, and Sharp pursor's test: Negative  TREATMENT DATE:  05/14/24 EXERCISE LOG  Exercise Repetitions and Resistance Comments  UBE L1 x 3 minutes each    Resisted row   GTB x 20 reps    Resisted pull down  GTB x 20 reps    Waist to overhead lift  5# x 25 reps    Wall push up   20 reps    Standing open books  15 reps each    Upper trapezius stretch  6 x 15  seconds each    Thoracic extension  15 reps  Seated   Manual therapy  STM to bilateral upper trapezius and levator scapulae  C4-6 CPA's grade I-III   Chin tuck  10 reps     Blank cell = exercise not performed today   05/12/24: UBE, level 1, forward 2.5, backward 2.5 Posterior shoulder rolls, 2' Self Flex/Ext SNAG with towel, v cues for mild tension at painful area, 2' Chin tucks w/ Self mobs using towel at painful area, v cues for form, 1' Upper Trap stretch, 2x30  Levator Scapulae stretch, 2x30 STM of Cervical Paraspinal musculature (bilateral upper trap/sub occipitals)                                                                                                                            05/08/2024  Manual Therapy: -CPA of Cervical spinal segments C2-C7, grade I-II mobilizations -Segmental lateral flexion mobilizations of cervical spine, bilaterally -Cervical distractions, 3 reps 10 second holds -STM of Cervical Paraspinal musculature (bilateral upper trap/sub occipitals) Therapeutic Exercise: -Bilateral ER, 2 sets of 15 reps,  Horizontal abductions, and diagonals, 2 sets of 10 reps, pt cued for sequencing and neutral cervical spine -Cervical snags rotations, 1 set of 10 reps bilaterally, pt cued for sequencing -UT and Levator stretches, 1 rep, 15 second holds bilaterally, tactile cues needed Neuromuscular Re-education: -Seated chin tucks, 1 set of 10 reps, 3 second holds, pt cued to remain in pain free ROM  05/06/24 Standing: RTB rows 2X10  RTB shoulder extension 2X10  Doorway stretch 2X30 Seated:  cervical ROM 5X each direction  Thoracic excursions with UE movements 5X each direction  Scapular retractions 10X5  UE flexion 10X  UT stretch 2X30 each side Seated:  manual STM to bil UT/scap region    PATIENT EDUCATION:  Education details: HEP, plan for next visit, and healing  Person educated: Patient Education method: Explanation Education comprehension:  verbalized understanding  HOME EXERCISE PROGRAM: Access Code: [email or web address] URL: [email or web address] Date: 04/27/2024 Prepared by: Lacinda Fass Exercises - Seated Upper Trapezius Stretch  - 1 x daily - 7 x weekly - 3 sets - 30 seconds hold - Seated Scapular Retraction  - 1 x daily - 7 x weekly - 3 sets - 10 reps  Access Code: [email or web address] URL: [email or web address] Date: 04/29/2024 - Standing Shoulder Flexion Full Range  - 2 x daily - 7 x weekly - 10 reps - Doorway Pec Stretch at 90 Degrees Abduction  - 2 x daily -  7 x weekly - 3 reps - 30 second hold Thoracic excursions with UE movements 5X each  Cervical ROM all 5X each  ASSESSMENT:  CLINICAL IMPRESSION: Patient was progressed with familiar interventions for improved postural reeducation and scapular stabilization. She required moderate multimodal cueing with today's interventions for proper biomechanics to avoid compensatory movement patterns. Manual therapy focused on soft tissue mobilization to her left upper trapezius and levator scapulae as this was the most effective at reducing her familiar symptoms. She reported feeling better upon the conclusion of treatment. Patient continues to require skilled physical therapy to address her remaining impairments to return to her prior level of function.    OBJECTIVE IMPAIRMENTS: decreased activity tolerance, decreased ROM, decreased strength, impaired tone, impaired UE functional use, and pain.   ACTIVITY LIMITATIONS: lifting and sleeping  PARTICIPATION LIMITATIONS: community activity  PERSONAL FACTORS: Time since onset of injury/illness/exacerbation and 3+ comorbidities: HTN, OA, and latex allergy  are also affecting patient's functional outcome.   REHAB POTENTIAL: Good  CLINICAL DECISION MAKING: Evolving/moderate complexity  EVALUATION COMPLEXITY: Moderate   GOALS: Goals reviewed with patient? Yes  LONG TERM GOALS: Target date: 05/25/24  Patient will be  independent with her HEP.  Baseline:  Goal status: INITIAL  2.  Patient will improve her NDI score to 7/50 or less for improved perceived function with her daily activities.  Baseline:  Goal status: INITIAL  3.  Patient will improve her left cervical rotation to at least 55 degrees for improved awareness of her surroundings.  Baseline:  Goal status: INITIAL  4.  Patient will be able to complete her daily activities without her familiar symptoms exceeding 8/10. Baseline:  Goal status: INITIAL  5.  Patient will improve her bilateral shoulder flexor strength to at least 4-/5 for improved function lifting.  Baseline:  Goal status: INITIAL  PLAN:  PT FREQUENCY: 1-2x/week  PT DURATION: 4 weeks  PLANNED INTERVENTIONS: 97164- PT Re-evaluation, 97750- Physical Performance Testing, 97110-Therapeutic exercises, 97530- Therapeutic activity, [id number]- Neuromuscular re-education, 97535- Self Care, 02859- Manual therapy, G0283- Electrical stimulation (unattended), 20560 (1-2 muscles), 20561 (3+ muscles)- Dry Needling, Patient/Family education, Joint mobilization, Spinal mobilization, Cryotherapy, and Moist heat  PLAN FOR NEXT SESSION:postural reeducation, and upper extremity strengthening. Manual as needed. Update HEP as needed.   Lacinda Fass, PT, DPT  12:01 PM, 05/14/24

## 2024-05-20 ENCOUNTER — Ambulatory Visit (HOSPITAL_COMMUNITY)

## 2024-05-20 ENCOUNTER — Encounter (HOSPITAL_COMMUNITY): Payer: Self-pay

## 2024-05-20 DIAGNOSIS — M542 Cervicalgia: Secondary | ICD-10-CM | POA: Diagnosis not present

## 2024-05-20 DIAGNOSIS — M6281 Muscle weakness (generalized): Secondary | ICD-10-CM

## 2024-05-20 NOTE — Therapy (Signed)
 OUTPATIENT PHYSICAL THERAPY CERVICAL TREATEMENT   Patient Name: Ashley Clements MRN: [id number] DOB:[birth year], 78 y.o., female Today's Date: 05/20/2024  END OF SESSION:  PT End of Session - 05/20/24 0855     Visit Number 7    Number of Visits 8    Date for Recertification  06/19/24    Authorization Type [employer] Medicare    Authorization Time Period 8 approved, 04/27/24-05/25/24    Authorization - Visit Number 7    Authorization - Number of Visits 8    Progress Note Due on Visit 8    PT Start Time 0858    PT Stop Time 0940    PT Time Calculation (min) 42 min    Activity Tolerance Patient tolerated treatment well    Behavior During Therapy WFL for tasks assessed/performed             Past Medical History:  Diagnosis Date   Anemia    Arthritis    Diabetes mellitus    borderline   GERD (gastroesophageal reflux disease)    Hypertension    Past Surgical History:  Procedure Laterality Date   ABDOMINAL HYSTERECTOMY     CHOLECYSTECTOMY     EYE SURGERY     Cataract left, Retina  detachment left   TOTAL KNEE ARTHROPLASTY  04/03/2012   Procedure: TOTAL KNEE ARTHROPLASTY;  Surgeon: Rome JULIANNA Pepper, MD;  Location: [facility] OR;  Service: Orthopedics;  Laterality: Right;   UTERINE FIBROID SURGERY     Patient Active Problem List   Diagnosis Date Noted   Syncope 04/24/2018   Essential hypertension 04/24/2018   Arthritis of knee 04/24/2018   Right-sided chest wall pain    Knee pain 04/30/2012   Knee stiffness 04/30/2012   Difficulty walking 04/30/2012    PCP: Benjamine Aland, MD   REFERRING PROVIDER: Benjamine Aland, MD   REFERRING DIAG: neck pain   THERAPY DIAG: Cervicalgia  Muscle weakness (generalized)  Rationale for Evaluation and Treatment: Rehabilitation  ONSET DATE: years ago  SUBJECTIVE:                                                                                                                                                                                                          SUBJECTIVE STATEMENT: Patient reports that she feels pretty good today, but the past few days have been a little rough.    Evaluation:  Patient reports that she has been having neck pain on and off for years. She then begin having wrist pin in the past month. She feels that her pain  is becoming more frequent than it used to be. However, she is unable to recall any injury that could have caused this. She had never had any pain like this previously.  Hand dominance: Right  PERTINENT HISTORY:  HTN, OA, and latex allergy   PAIN:  Are you having pain? Yes: NPRS scale: Current: no pain score provided Worst: 10/10 Pain location: neck and radiates to left shoulder Pain description: stinging Aggravating factors: none known Relieving factors: using topical cream   PRECAUTIONS: None  RED FLAGS: None     WEIGHT BEARING RESTRICTIONS: No  FALLS:  Has patient fallen in last 6 months? No  LIVING ENVIRONMENT: Lives with: lives alone Lives in: House/apartment  OCCUPATION: retired  PLOF: Independent  PATIENT GOALS: reduced pain, and improve mobility  NEXT MD VISIT: mid November 2025  OBJECTIVE:  Note: Objective measures were completed at Evaluation unless otherwise noted.  PATIENT SURVEYS:  NDI:  NECK DISABILITY INDEX  Date: 04/27/24 Score  Pain intensity 0 = I have no pain at the moment  2. Personal care (washing, dressing, etc.) 0 = I can look after myself normally without causing extra pain  3. Lifting 4 =  I can only lift very light weights  4. Reading 0 = I can read as much as I want to with no pain in my neck  5. Headaches 3 = I have moderate headaches, which come frequently  6. Concentration 2 = I have a fair degree of difficulty in concentrating when I want to  7. Work 0 =  I can do as much work as I want to  8. Driving 0 = I can drive my car without any neck pain  9. Sleeping 4 = My sleep is greatly disturbed (3-5 hrs sleepless)   10. Recreation 0 = I  am able to engage in all my recreation activities with no neck pain at all  Total 13/50   Minimum Detectable Change (90% confidence): 5 points or 10% points  COGNITION: Overall cognitive status: Within functional limits for tasks assessed  SENSATION: Light touch: Impaired  and diminished sensation at C4 dermatome on the right and C5-T1 dermatomes bilaterally  Patient reports numbness in her right hand  POSTURE: forward head  PALPATION: TTP: bilateral upper trapezius with palpation to L upper trapezius reproducing her familiar symptoms   CERVICAL ROM: patient exhibited difficulty isolating cervical rotation   Active ROM A/PROM (deg) eval  Flexion 15  Extension 50  Right lateral flexion 25% limited; going to the left feels better than the right  Left lateral flexion 25% limited  Right rotation 60  Left rotation 48   (Blank rows = not tested)  UPPER EXTREMITY ROM: WFL for activities assessed  UPPER EXTREMITY MMT:  MMT Right eval Left eval  Shoulder flexion 3+/5 3+/5  Shoulder extension    Shoulder abduction    Shoulder adduction 3+/5 3+/5; shoulder pain   Shoulder extension    Shoulder internal rotation    Shoulder external rotation    Middle trapezius    Lower trapezius    Elbow flexion 4+/5 4/5  Elbow extension 4+/5 4/5  Wrist flexion    Wrist extension    Wrist ulnar deviation    Wrist radial deviation    Wrist pronation    Wrist supination    Grip strength     (Blank rows = not tested)  CERVICAL SPECIAL TESTS:  Spurling's test: Negative, Distraction test: Positive, and Sharp pursor's test: Negative  TREATMENT DATE:  05/20/24 EXERCISE LOG  Exercise Repetitions and Resistance Comments  UBE  L1 x 3 minutes forward and backward   Resisted row   GTB x 10 reps    Resisted pull down  GTB x 20 reps    Manual therapy   STM to left upper trapezius, levator scapulae, and scapular stabilizers    Walk up the wall  RTB x  2.5 minutes   Upper trapezius stretch  3 x 30 seconds each     Blank cell = exercise not performed today                                    05/14/24 EXERCISE LOG  Exercise Repetitions and Resistance Comments  UBE L1 x 3 minutes each    Resisted row   GTB x 20 reps    Resisted pull down  GTB x 20 reps    Waist to overhead lift  5# x 25 reps    Wall push up   20 reps    Standing open books  15 reps each    Upper trapezius stretch  6 x 15 seconds each    Thoracic extension  15 reps  Seated   Manual therapy  STM to bilateral upper trapezius and levator scapulae  C4-6 CPA's grade I-III   Chin tuck  10 reps     Blank cell = exercise not performed today   05/12/24: UBE, level 1, forward 2.5, backward 2.5 Posterior shoulder rolls, 2' Self Flex/Ext SNAG with towel, v cues for mild tension at painful area, 2' Chin tucks w/ Self mobs using towel at painful area, v cues for form, 1' Upper Trap stretch, 2x30  Levator Scapulae stretch, 2x30 STM of Cervical Paraspinal musculature (bilateral upper trap/sub occipitals)                                                                                                                            05/08/2024  Manual Therapy: -CPA of Cervical spinal segments C2-C7, grade I-II mobilizations -Segmental lateral flexion mobilizations of cervical spine, bilaterally -Cervical distractions, 3 reps 10 second holds -STM of Cervical Paraspinal musculature (bilateral upper trap/sub occipitals) Therapeutic Exercise: -Bilateral ER, 2 sets of 15 reps,  Horizontal abductions, and diagonals, 2 sets of 10 reps, pt cued for sequencing and neutral cervical spine -Cervical snags rotations, 1 set of 10 reps bilaterally, pt cued for sequencing -UT and Levator stretches, 1 rep, 15 second holds bilaterally, tactile cues needed Neuromuscular Re-education: -Seated chin tucks, 1 set of 10 reps, 3 second holds, pt cued to remain in pain free ROM  PATIENT EDUCATION:   Education details: HEP, plan for next visit, and healing  Person educated: Patient Education method: Explanation Education comprehension: verbalized understanding  HOME EXERCISE PROGRAM: Access Code: [email or web address] URL: [email or web address] Date: 04/27/2024 Prepared by: Lacinda Fass Exercises - Seated Upper Trapezius Stretch  - 1 x  daily - 7 x weekly - 3 sets - 30 seconds hold - Seated Scapular Retraction  - 1 x daily - 7 x weekly - 3 sets - 10 reps  Access Code: [email or web address] URL: [email or web address] Date: 04/29/2024 - Standing Shoulder Flexion Full Range  - 2 x daily - 7 x weekly - 10 reps - Doorway Pec Stretch at 90 Degrees Abduction  - 2 x daily - 7 x weekly - 3 reps - 30 second hold Thoracic excursions with UE movements 5X each  Cervical ROM all 5X each  ASSESSMENT:  CLINICAL IMPRESSION: Today's treatment focused on manual therapy and familiar interventions as this was the most effective at reducing her familiar symptoms. Soft tissue mobilization to her left upper trapezius and scapular stabilizers were the most effective at reducing her familiar symptoms. She required moderate verbal and tactile cueing with resisted rows and pull downs for proper biomechanics to facilitate scapular stability. She experienced no increase in pain or discomfort with any of today's interventions. She reported feeling better upon the conclusion of treatment. Patient continues to require skilled physical therapy to address her remaining impairments to return to her prior level of function.    OBJECTIVE IMPAIRMENTS: decreased activity tolerance, decreased ROM, decreased strength, impaired tone, impaired UE functional use, and pain.   ACTIVITY LIMITATIONS: lifting and sleeping  PARTICIPATION LIMITATIONS: community activity  PERSONAL FACTORS: Time since onset of injury/illness/exacerbation and 3+ comorbidities: HTN, OA, and latex allergy  are also affecting patient's functional  outcome.   REHAB POTENTIAL: Good  CLINICAL DECISION MAKING: Evolving/moderate complexity  EVALUATION COMPLEXITY: Moderate   GOALS: Goals reviewed with patient? Yes  LONG TERM GOALS: Target date: 05/25/24  Patient will be independent with her HEP.  Baseline:  Goal status: INITIAL  2.  Patient will improve her NDI score to 7/50 or less for improved perceived function with her daily activities.  Baseline:  Goal status: INITIAL  3.  Patient will improve her left cervical rotation to at least 55 degrees for improved awareness of her surroundings.  Baseline:  Goal status: INITIAL  4.  Patient will be able to complete her daily activities without her familiar symptoms exceeding 8/10. Baseline:  Goal status: INITIAL  5.  Patient will improve her bilateral shoulder flexor strength to at least 4-/5 for improved function lifting.  Baseline:  Goal status: INITIAL  PLAN:  PT FREQUENCY: 1-2x/week  PT DURATION: 4 weeks  PLANNED INTERVENTIONS: 97164- PT Re-evaluation, 97750- Physical Performance Testing, 97110-Therapeutic exercises, 97530- Therapeutic activity, [id number]- Neuromuscular re-education, 97535- Self Care, 02859- Manual therapy, [id number]- Electrical stimulation (unattended), 20560 (1-2 muscles), 20561 (3+ muscles)- Dry Needling, Patient/Family education, Joint mobilization, Spinal mobilization, Cryotherapy, and Moist heat  PLAN FOR NEXT SESSION:postural reeducation, and upper extremity strengthening. Manual as needed. Update HEP as needed.   Lacinda Fass, PT, DPT  11:00 AM, 05/20/24

## 2024-05-27 ENCOUNTER — Encounter (HOSPITAL_COMMUNITY): Payer: Self-pay

## 2024-05-27 ENCOUNTER — Ambulatory Visit (HOSPITAL_COMMUNITY): Attending: Family Medicine

## 2024-05-27 DIAGNOSIS — M542 Cervicalgia: Secondary | ICD-10-CM | POA: Diagnosis present

## 2024-05-27 DIAGNOSIS — M6281 Muscle weakness (generalized): Secondary | ICD-10-CM | POA: Insufficient documentation

## 2024-05-27 NOTE — Therapy (Signed)
 OUTPATIENT PHYSICAL THERAPY CERVICAL TREATEMENT  Progress Note Reporting Period 04/27/24 to 05/27/24  See note below for Objective Data and Assessment of Progress/Goals.     Patient Name: Ashley Clements MRN: [id number] DOB:07-Aug-1945, 78 y.o., female Today's Date: 05/27/2024  END OF SESSION:  PT End of Session - 05/27/24 0944     Visit Number 8    Number of Visits 8    Date for Recertification  06/19/24    Authorization Type [employer] Medicare    Authorization Time Period 8 approved, 04/27/24-05/25/24    Authorization - Visit Number 8    Authorization - Number of Visits 8    Progress Note Due on Visit 8    PT Start Time [phone]    PT Stop Time 1025    PT Time Calculation (min) 39 min    Activity Tolerance Patient tolerated treatment well    Behavior During Therapy WFL for tasks assessed/performed             Past Medical History:  Diagnosis Date   Anemia    Arthritis    Diabetes mellitus    borderline   GERD (gastroesophageal reflux disease)    Hypertension    Past Surgical History:  Procedure Laterality Date   ABDOMINAL HYSTERECTOMY     CHOLECYSTECTOMY     EYE SURGERY     Cataract left, Retina  detachment left   TOTAL KNEE ARTHROPLASTY  04/03/2012   Procedure: TOTAL KNEE ARTHROPLASTY;  Surgeon: [place] JULIANNA Pepper, MD;  Location: [facility] OR;  Service: Orthopedics;  Laterality: Right;   UTERINE FIBROID SURGERY     Patient Active Problem List   Diagnosis Date Noted   Syncope 04/24/2018   Essential hypertension 04/24/2018   Arthritis of knee 04/24/2018   Right-sided chest wall pain    Knee pain 04/30/2012   Knee stiffness 04/30/2012   Difficulty walking 04/30/2012    PCP: Benjamine Aland, MD   REFERRING PROVIDER: Benjamine Aland, MD   REFERRING DIAG: neck pain   THERAPY DIAG: Cervicalgia  Muscle weakness (generalized)  Rationale for Evaluation and Treatment: Rehabilitation  ONSET DATE: years ago  SUBJECTIVE:                                                                                                                                                                                                          SUBJECTIVE STATEMENT: Neck is feeling good today, no reports of pain.  Was able to make it through the holidays pain free.  Evaluation:  Patient reports that she has been having neck  pain on and off for years. She then begin having wrist pin in the past month. She feels that her pain is becoming more frequent than it used to be. However, she is unable to recall any injury that could have caused this. She had never had any pain like this previously.  Hand dominance: Right  PERTINENT HISTORY:  HTN, OA, and latex allergy   PAIN:  Are you having pain? Yes: NPRS scale: Current: no pain score provided Worst: 10/10 Pain location: neck and radiates to left shoulder Pain description: stinging Aggravating factors: none known Relieving factors: using topical cream   PRECAUTIONS: None  RED FLAGS: None     WEIGHT BEARING RESTRICTIONS: No  FALLS:  Has patient fallen in last 6 months? No  LIVING ENVIRONMENT: Lives with: lives alone Lives in: House/apartment  OCCUPATION: retired  PLOF: Independent  PATIENT GOALS: reduced pain, and improve mobility  NEXT MD VISIT: 2nd week of December 2025  OBJECTIVE:  Note: Objective measures were completed at Evaluation unless otherwise noted.  PATIENT SURVEYS:  NDI:  NECK DISABILITY INDEX   Date: 04/27/24 Score 05/27/24  Pain intensity 0 = I have no pain at the moment 0 = I have no pain at the moment  2. Personal care (washing, dressing, etc.) 0 = I can look after myself normally without causing extra pain 0 = I can look after myself normally without causing extra pain  3. Lifting 3 = Pain prevents me from lifting heavy weights but I can manage light to medium   weights if they are conveniently positioned 3 = Pain prevents me from lifting heavy weights but I can manage light to medium   weights if they  are conveniently positioned  4. Reading 0 = I can read as much as I want to with no pain in my neck 0 = I can read as much as I want to with no pain in my neck  5. Headaches 3 = I have moderate headaches, which come frequently 1 =  I have slight headaches, which come infrequently  6. Concentration 2 = I have a fair degree of difficulty in concentrating when I want to 2 = I have a fair degree of difficulty in concentrating when I want to  7. Work 0 =  I can do as much work as I want to 0 =  I can do as much work as I want to  8. Driving 0 = I can drive my car without any neck pain 0 = I can drive my car without any neck pain  9. Sleeping 4 = My sleep is greatly disturbed (3-5 hrs sleepless)  4 = My sleep is greatly disturbed (3-5 hrs sleepless)   10. Recreation 0 = I am able to engage in all my recreation activities with no neck pain at all 0 = I am able to engage in all my recreation activities with no neck pain at all  Total 13/50 10/50= 20%   Minimum Detectable Change (90% confidence): 5 points or 10% points 3 = I have moderate headaches, which come frequently COGNITION: Overall cognitive status: Within functional limits for tasks assessed  SENSATION: Light touch: Impaired  and diminished sensation at C4 dermatome on the right and C5-T1 dermatomes bilaterally  Patient reports numbness in her right hand  POSTURE: forward head  PALPATION: TTP: bilateral upper trapezius with palpation to L upper trapezius reproducing her familiar symptoms   CERVICAL ROM: patient exhibited difficulty isolating cervical rotation  Active ROM A/PROM (deg) eval AROM  05/27/24  Flexion 15 23  Extension 50 50  Right lateral flexion 25% limited; going to the left feels better than the right 40  Left lateral flexion 25% limited 38  Right rotation 60 60  Left rotation 48 60   (Blank rows = not tested)  UPPER EXTREMITY ROM: WFL for activities assessed  UPPER EXTREMITY MMT:  MMT Right eval Left eval  Right 05/27/24 Left 05/27/24  Shoulder flexion 3+/5 3+/5 4+/5 4+  Shoulder extension      Shoulder abduction      Shoulder adduction 3+/5 3+/5; shoulder pain  4+ 4/5  Shoulder extension      Shoulder internal rotation      Shoulder external rotation      Middle trapezius      Lower trapezius      Elbow flexion 4+/5 4/5 5 5   Elbow extension 4+/5 4/5 5 5   Wrist flexion      Wrist extension      Wrist ulnar deviation      Wrist radial deviation      Wrist pronation      Wrist supination      Grip strength       (Blank rows = not tested)  CERVICAL SPECIAL TESTS:  Spurling's test: Negative, Distraction test: Positive, and Sharp pursor's test: Negative  TREATMENT DATE:       05/27/24: UBE L2 3 minutes forward and backward ROM MMT NDI 10/50= 20%                                  05/20/24 EXERCISE LOG  Exercise Repetitions and Resistance Comments  UBE  L1 x 3 minutes forward and backward   Resisted row   GTB x 10 reps    Resisted pull down  GTB x 20 reps    Manual therapy   STM to left upper trapezius, levator scapulae, and scapular stabilizers    Walk up the wall  RTB x 2.5 minutes   Upper trapezius stretch  3 x 30 seconds each     Blank cell = exercise not performed today                                    05/14/24 EXERCISE LOG  Exercise Repetitions and Resistance Comments  UBE L1 x 3 minutes each    Resisted row   GTB x 20 reps    Resisted pull down  GTB x 20 reps    Waist to overhead lift  5# x 25 reps    Wall push up   20 reps    Standing open books  15 reps each    Upper trapezius stretch  6 x 15 seconds each    Thoracic extension  15 reps  Seated   Manual therapy  STM to bilateral upper trapezius and levator scapulae  C4-6 CPA's grade I-III   Chin tuck  10 reps     Blank cell = exercise not performed today   05/12/24: UBE, level 1, forward 2.5, backward 2.5 Posterior shoulder rolls, 2' Self Flex/Ext SNAG with towel, v cues for mild tension at painful area,  2' Chin tucks w/ Self mobs using towel at painful area, v cues for form, 1' Upper Trap stretch, 2x30  Levator Scapulae stretch, 2x30 STM of Cervical  Paraspinal musculature (bilateral upper trap/sub occipitals)                                                                                                                            05/08/2024  Manual Therapy: -CPA of Cervical spinal segments C2-C7, grade I-II mobilizations -Segmental lateral flexion mobilizations of cervical spine, bilaterally -Cervical distractions, 3 reps 10 second holds -STM of Cervical Paraspinal musculature (bilateral upper trap/sub occipitals) Therapeutic Exercise: -Bilateral ER, 2 sets of 15 reps,  Horizontal abductions, and diagonals, 2 sets of 10 reps, pt cued for sequencing and neutral cervical spine -Cervical snags rotations, 1 set of 10 reps bilaterally, pt cued for sequencing -UT and Levator stretches, 1 rep, 15 second holds bilaterally, tactile cues needed Neuromuscular Re-education: -Seated chin tucks, 1 set of 10 reps, 3 second holds, pt cued to remain in pain free ROM  PATIENT EDUCATION:  Education details: HEP, plan for next visit, and healing  Person educated: Patient Education method: Explanation Education comprehension: verbalized understanding  HOME EXERCISE PROGRAM: Access Code: [email or web address] URL: [email or web address] Date: 04/27/2024 Prepared by: Lacinda Fass Exercises - Seated Upper Trapezius Stretch  - 1 x daily - 7 x weekly - 3 sets - 30 seconds hold - Seated Scapular Retraction  - 1 x daily - 7 x weekly - 3 sets - 10 reps  Access Code: [email or web address] URL: [email or web address] Date: 04/29/2024 - Standing Shoulder Flexion Full Range  - 2 x daily - 7 x weekly - 10 reps - Doorway Pec Stretch at 90 Degrees Abduction  - 2 x daily - 7 x weekly - 3 reps - 30 second hold Thoracic excursions with UE movements 5X each  Cervical ROM all 5X each  GTB postural  strengthening  ASSESSMENT:  CLINICAL IMPRESSION: Reviewed goals with the following findings:  Pt has met 4/5 LTGs.  Presents with improved cervical mobility, improved shoulder strength and improved NDI by 3 points.  Reports compliance with HEP daily and able to recall current exercise program.  Pt continues to have short duration reports of pain that can be high pain scale, reports less frequent and reduced intensity.  Pt continues to have pain at night.  DC to HEP.  PHYSICAL THERAPY DISCHARGE SUMMARY  Visits from Start of Care: 8  Current functional level related to goals / functional outcomes: Patient was able to meet most of her goals for skilled physical therapy.    Remaining deficits: None    Education / Equipment: HEP    Patient agrees to discharge. Patient goals were partially met. Patient is being discharged due to being pleased with the current functional level.  Lacinda Fass, PT, DPT    OBJECTIVE IMPAIRMENTS: decreased activity tolerance, decreased ROM, decreased strength, impaired tone, impaired UE functional use, and pain.   ACTIVITY LIMITATIONS: lifting and sleeping  PARTICIPATION LIMITATIONS: community activity  PERSONAL FACTORS: Time since onset of injury/illness/exacerbation and 3+ comorbidities: HTN, OA, and latex allergy  are also affecting patient's functional outcome.  REHAB POTENTIAL: Good  CLINICAL DECISION MAKING: Evolving/moderate complexity  EVALUATION COMPLEXITY: Moderate   GOALS: Goals reviewed with patient? Yes  LONG TERM GOALS: Target date: 05/25/24  Patient will be independent with her HEP.  Baseline: 05/27/24:  Reports compliance with HEP daily Goal status: MET  2.  Patient will improve her NDI score to 7/50 or less for improved perceived function with her daily activities.  Baseline: 05/27/24:  10/50, was 13/50 Eval Goal status: In progress  3.  Patient will improve her left cervical rotation to at least 55 degrees for improved  awareness of her surroundings.  Baseline:  Goal status: MET  4.  Patient will be able to complete her daily activities without her familiar symptoms exceeding 8/10. Baseline: 05/27/24:  Reports pain is very short duration, pain does not get exceed 8/10. Goal status: MET  5.  Patient will improve her bilateral shoulder flexor strength to at least 4-/5 for improved function lifting.  Baseline: 05/27/24:  see MMT above Goal status: MET  PLAN:  PT FREQUENCY: 1-2x/week  PT DURATION: 4 weeks  PLANNED INTERVENTIONS: 97164- PT Re-evaluation, 97750- Physical Performance Testing, 97110-Therapeutic exercises, 97530- Therapeutic activity, 97112- Neuromuscular re-education, 97535- Self Care, 02859- Manual therapy, G0283- Electrical stimulation (unattended), 20560 (1-2 muscles), 20561 (3+ muscles)- Dry Needling, Patient/Family education, Joint mobilization, Spinal mobilization, Cryotherapy, and Moist heat  PLAN FOR NEXT SESSION: DC to HEP  Augustin Mclean, LPTA/CLT; CBIS [phone]  10:31 AM, 05/27/24

## 2024-05-29 ENCOUNTER — Ambulatory Visit (HOSPITAL_COMMUNITY)

## 2024-06-02 ENCOUNTER — Ambulatory Visit (HOSPITAL_COMMUNITY)

## 2024-06-04 ENCOUNTER — Ambulatory Visit (HOSPITAL_COMMUNITY): Admitting: Physical Therapy

## 2024-06-09 ENCOUNTER — Ambulatory Visit (HOSPITAL_COMMUNITY)

## 2024-06-11 ENCOUNTER — Ambulatory Visit (HOSPITAL_COMMUNITY)

## 2024-06-16 ENCOUNTER — Ambulatory Visit (HOSPITAL_COMMUNITY): Admitting: Physical Therapy

## 2024-06-22 ENCOUNTER — Ambulatory Visit (HOSPITAL_COMMUNITY)

## 2024-06-26 ENCOUNTER — Ambulatory Visit (HOSPITAL_COMMUNITY)

## 2024-07-16 ENCOUNTER — Other Ambulatory Visit (HOSPITAL_COMMUNITY): Payer: Self-pay | Admitting: Family Medicine

## 2024-07-16 DIAGNOSIS — R413 Other amnesia: Secondary | ICD-10-CM

## 2024-07-28 ENCOUNTER — Ambulatory Visit (HOSPITAL_COMMUNITY)
Admission: RE | Admit: 2024-07-28 | Discharge: 2024-07-28 | Disposition: A | Source: Ambulatory Visit | Attending: Family Medicine | Admitting: Family Medicine

## 2024-07-28 DIAGNOSIS — Z8673 Personal history of transient ischemic attack (TIA), and cerebral infarction without residual deficits: Secondary | ICD-10-CM | POA: Diagnosis not present

## 2024-07-28 DIAGNOSIS — R413 Other amnesia: Secondary | ICD-10-CM
